# Patient Record
Sex: Female | Born: 1970 | Race: White | Hispanic: No | Smoking: Never smoker
Health system: Southern US, Community
[De-identification: ages and names within clinical notes are randomized; demographics above are authoritative.]

## PROBLEM LIST (undated history)

## (undated) DIAGNOSIS — K5792 Diverticulitis of intestine, part unspecified, without perforation or abscess without bleeding: Secondary | ICD-10-CM

## (undated) DIAGNOSIS — R569 Unspecified convulsions: Secondary | ICD-10-CM

## (undated) DIAGNOSIS — F32A Depression, unspecified: Secondary | ICD-10-CM

## (undated) HISTORY — PX: COLOSTOMY REVERSAL: SHX5782

---

## 2016-09-24 DIAGNOSIS — F419 Anxiety disorder, unspecified: Secondary | ICD-10-CM | POA: Insufficient documentation

## 2016-11-10 DIAGNOSIS — F1190 Opioid use, unspecified, uncomplicated: Secondary | ICD-10-CM | POA: Insufficient documentation

## 2019-05-02 ENCOUNTER — Emergency Department (HOSPITAL_COMMUNITY): Payer: Self-pay

## 2019-05-02 ENCOUNTER — Emergency Department (HOSPITAL_COMMUNITY)
Admission: EM | Admit: 2019-05-02 | Discharge: 2019-05-02 | Disposition: A | Discharge status: Home / Self Care | Payer: Self-pay | Attending: Emergency Medicine | Admitting: Emergency Medicine

## 2019-05-02 ENCOUNTER — Other Ambulatory Visit: Payer: Self-pay

## 2019-05-02 DIAGNOSIS — W450XXA Nail entering through skin, initial encounter: Secondary | ICD-10-CM | POA: Insufficient documentation

## 2019-05-02 DIAGNOSIS — S91331A Puncture wound without foreign body, right foot, initial encounter: Secondary | ICD-10-CM | POA: Insufficient documentation

## 2019-05-02 DIAGNOSIS — Y929 Unspecified place or not applicable: Secondary | ICD-10-CM | POA: Insufficient documentation

## 2019-05-02 DIAGNOSIS — Y9389 Activity, other specified: Secondary | ICD-10-CM | POA: Insufficient documentation

## 2019-05-02 DIAGNOSIS — Y999 Unspecified external cause status: Secondary | ICD-10-CM | POA: Insufficient documentation

## 2019-05-02 DIAGNOSIS — Z5321 Procedure and treatment not carried out due to patient leaving prior to being seen by health care provider: Secondary | ICD-10-CM | POA: Insufficient documentation

## 2019-05-02 MED ORDER — LIDOCAINE HCL (PF) 1 % IJ SOLN: 5.0000 mL | Freq: Once | INTRAMUSCULAR | Status: DC

## 2019-05-02 NOTE — ED Notes (Signed)
Pt stated that she will no longer be waiting. Armband removed and pt walked out of ED.

## 2019-05-02 NOTE — ED Provider Notes (Signed)
MSE was initiated and I personally evaluated the patient and placed orders (if any) at  2:30 PM on May 02, 2019.  The patient appears stable so that the remainder of the MSE may be completed by another provider. Pt stepped on a nail.  Pt appears to have a foreign body in foot.  Xray ordered   Elson Areas, PA-C 05/02/19 1431    Gwyneth Sprout, MD 05/02/19 [phone]

## 2019-05-02 NOTE — ED Triage Notes (Signed)
Last night pt was kicking wood to break it up for a fire and a nail went through her shoe and punctured R her foot. Pt has small amount of redness to site. Endorses pain.

## 2019-05-03 ENCOUNTER — Emergency Department (HOSPITAL_COMMUNITY)
Admission: EM | Admit: 2019-05-03 | Discharge: 2019-05-03 | Disposition: A | Discharge status: Home / Self Care | Payer: Medicaid Other | Attending: Emergency Medicine | Admitting: Emergency Medicine

## 2019-05-03 DIAGNOSIS — W450XXA Nail entering through skin, initial encounter: Secondary | ICD-10-CM | POA: Insufficient documentation

## 2019-05-03 DIAGNOSIS — M545 Low back pain, unspecified: Secondary | ICD-10-CM

## 2019-05-03 DIAGNOSIS — Y929 Unspecified place or not applicable: Secondary | ICD-10-CM | POA: Insufficient documentation

## 2019-05-03 DIAGNOSIS — Y998 Other external cause status: Secondary | ICD-10-CM | POA: Insufficient documentation

## 2019-05-03 DIAGNOSIS — M79671 Pain in right foot: Secondary | ICD-10-CM

## 2019-05-03 DIAGNOSIS — S91331A Puncture wound without foreign body, right foot, initial encounter: Secondary | ICD-10-CM | POA: Insufficient documentation

## 2019-05-03 DIAGNOSIS — T148XXA Other injury of unspecified body region, initial encounter: Secondary | ICD-10-CM

## 2019-05-03 DIAGNOSIS — Y9389 Activity, other specified: Secondary | ICD-10-CM | POA: Insufficient documentation

## 2019-05-03 LAB — CBC
HCT: 41 % (ref 36.0–46.0)
Hemoglobin: 13.7 g/dL (ref 12.0–15.0)
MCH: 34 pg (ref 26.0–34.0)
MCHC: 33.4 g/dL (ref 30.0–36.0)
MCV: 101.7 fL — ABNORMAL HIGH (ref 80.0–100.0)
Platelets: 208 10*3/uL (ref 150–400)
RBC: 4.03 MIL/uL (ref 3.87–5.11)
RDW: 13.8 % (ref 11.5–15.5)
WBC: 7.6 10*3/uL (ref 4.0–10.5)
nRBC: 0 % (ref 0.0–0.2)

## 2019-05-03 MED ORDER — NAPROXEN 500 MG PO TABS: 500.0000 mg | ORAL_TABLET | Freq: Two times a day (BID) | ORAL | 0 refills | Status: DC

## 2019-05-03 MED ORDER — METHOCARBAMOL 500 MG PO TABS
500.0000 mg | ORAL_TABLET | Freq: Three times a day (TID) | ORAL | 0 refills | Status: DC | PRN
Start: 2019-05-03 — End: 2019-06-13

## 2019-05-03 MED ORDER — CIPROFLOXACIN HCL 500 MG PO TABS
500.0000 mg | ORAL_TABLET | Freq: Two times a day (BID) | ORAL | 0 refills | Status: DC
Start: 2019-05-03 — End: 2019-06-13

## 2019-05-03 NOTE — ED Triage Notes (Addendum)
Patient reports she went to step on a piece of fire wood yesterday with flip flops , stepped on nail and it went through her foot. bottom Right foot around great toe area noted to be reddened. Patient states when she jerked back she also hurt her back, so she is in pain from her back to her foot. Patient states her tetanus is UTD

## 2019-05-03 NOTE — Discharge Instructions (Addendum)
You were seen in the ED today for a foot injury.  Your xray from yesterday did not show any fractures or retained foreign bodies.   We are sending you home with the following medicines to help with pain:  - Naproxen is a nonsteroidal anti-inflammatory medication that will help with pain and swelling. Be sure to take this medication as prescribed with food, 1 pill every 12 hours,  It should be taken with food, as it can cause stomach upset, and more seriously, stomach bleeding. Do not take other nonsteroidal anti-inflammatory medications with this such as Advil, Motrin, Aleve, Mobic, Goodie Powder, or Motrin.    - Robaxin is the muscle relaxer I have prescribed, this is meant to help with muscle tightness. Be aware that this medication may make you drowsy therefore the first time you take this it should be at a time you are in an environment where you can rest. Do not drive or operate heavy machinery when taking this medication. Do not drink alcohol or take other sedating medications with this medicine such as narcotics or benzodiazepines.   You make take Tylenol per over the counter dosing with these medications.   We are also sending you home with Ciprofloxacin, an antibiotic, this is to help prevent infection.  The specific antibiotic makes you higher risk for tendon/muscle injury such as an Achilles tendon tear, do not participate in sports, quick movements, or exercise type activities while taking this medication.   We have prescribed you new medication(s) today. Discuss the medications prescribed today with your pharmacist as they can have adverse effects and interactions with your other medicines including over the counter and prescribed medications. Seek medical evaluation if you start to experience new or abnormal symptoms after taking one of these medicines, seek care immediately if you start to experience difficulty breathing, feeling of your throat closing, facial swelling, or rash as these  could be indications of a more serious allergic reaction   Please follow-up with your primary care provider within 3 days for wound recheck.  Return to the ER for new or worsening symptoms including but not limited to increased pain, spreading redness, drainage, fever, chills, or any other concerns.

## 2019-05-03 NOTE — ED Provider Notes (Signed)
[facility] DEPT Provider Note   CSN: [id number] Arrival date & time: 05/03/19  4098     History Chief Complaint  Patient presents with  . Foot Pain  . Back Pain    Tina Torres is a 49 y.o. female without significant past medical history who presents to the emergency department with right foot pain status post injury 1.5 days prior.  Patient states that she was trying to break a piece of wood when she stomped down on it which resulted in a nail puncturing her foot through the bottom of her rubber sandal.  She states that she had to pull the nail out of her foot, she immediately cleaned this in the shower.  States she is having pain to the foot as well as to the right lower back, she thinks she hurt the back with the impact of the stomping.  She is also noticed some mild redness to the puncture wound site of the foot.  Pain is worse with weightbearing, no alleviating factors.  She went to an alternative ED yesterday but left due to wait times.  She denies fever, drainage from the wound, chills, expanding redness, numbness, weakness, or incontinence.  She denies history of diabetes.  Her last tetanus was within the past 5 years.  HPI     History reviewed. No pertinent past medical history.  There are no problems to display for this patient.   History reviewed. No pertinent surgical history.   OB History   No obstetric history on file.     History reviewed. No pertinent family history.  Social History   Tobacco Use  . Smoking status: Not on file  Substance Use Topics  . Alcohol use: Not on file  . Drug use: Not on file    Home Medications Prior to Admission medications   Not on File    Allergies    Patient has no known allergies.  Review of Systems   Review of Systems  Constitutional: Negative for chills and fever.  Respiratory: Negative for shortness of breath.   Cardiovascular: Negative for chest pain.  Gastrointestinal:  Negative for abdominal pain, nausea and vomiting.  Musculoskeletal: Positive for arthralgias and back pain.  Skin: Positive for color change and wound.  Neurological: Negative for weakness and numbness.       Negative for incontinence or saddle anesthesia.  All other systems reviewed and are negative.   Physical Exam Updated Vital Signs BP 129/78 (BP Location: Left Arm)   Pulse 90   Temp 98.2 F (36.8 C) (Oral)   Resp 16   Ht 5\' 2"  (1.575 m)   Wt 68 kg   SpO2 98%   BMI 27.44 kg/m   Physical Exam Vitals and nursing note reviewed.  Constitutional:      General: She is not in acute distress.    Appearance: She is not ill-appearing or toxic-appearing.  HENT:     Head: Normocephalic and atraumatic.  Cardiovascular:     Pulses:          Dorsalis pedis pulses are 2+ on the right side and 2+ on the left side.       Posterior tibial pulses are 2+ on the right side and 2+ on the left side.  Pulmonary:     Effort: Pulmonary effort is normal.  Musculoskeletal:     Comments: Back: No midline tenderness to palpation.  Right gluteal tenderness and right lower paraspinal muscle tenderness. Lower extremities: Patient has findings  consistent with a puncture wound to the plantar aspect of the first MTP area.  There is mild surrounding erythema.  No purulent drainage.  No palpable or visible foreign bodies.  No palpable fluctuance.  This area as well as the diffuse forefoot is tender to palpation.  Lower extremities are otherwise nontender.  Neurovascularly intact distally.  Skin:    General: Skin is warm and dry.     Capillary Refill: Capillary refill takes less than 2 seconds.  Neurological:     Mental Status: She is alert.     Comments: Alert. Clear speech. Sensation grossly intact to bilateral lower extremities. 5/5 strength with plantar/dorsiflexion bilaterally. Patient ambulatory with somewhat antalgic gait.  Psychiatric:        Mood and Affect: Mood normal.        Behavior: Behavior  normal.         ED Results / Procedures / Treatments   Labs (all labs ordered are listed, but only abnormal results are displayed) Labs Reviewed  CBC - Abnormal; Notable for the following components:      Result Value   MCV 101.7 (*)    All other components within normal limits    EKG None  Radiology DG Foot 2 Views Right  Result Date: 05/02/2019 CLINICAL DATA:  Nail puncture injury of the right foot last night. EXAM: RIGHT FOOT - 2 VIEW COMPARISON:  None. FINDINGS: No evidence of fracture or radiopaque foreign object. No arthritis. No soft tissue abnormality seen. IMPRESSION: Negative radiographs. Electronically Signed   By: Paulina Fusi M.D.   On: 05/02/2019 14:44    Procedures Procedures (including critical care time)  Medications Ordered in ED Medications - No data to display  ED Course  I have reviewed the triage vital signs and the nursing notes.  Pertinent labs & imaging results that were available during my care of the patient were reviewed by me and considered in my medical decision making (see chart for details).    MDM Rules/Calculators/A&P                      Patient presents to the emergency department status post right foot injury during which she had a nail puncture her right foot through her shoe.  She is nontoxic, resting comfortably, vitals WNL. CBC obtained per triage reviewed- no leukocytosis.  X-ray obtained at ED visit yesterday- personally reviewed & interpreted-no evidence of fracture or radiopaque foreign body seen.  She has not had recurrent injury therefore do not feel this needs repeat imaging.  She has no midline spinal tenderness therefore do not feel that back imaging is necessary, back pain likely muscular in nature.  She has some mild surrounding erythema to the puncture wound site.  Will start on ciprofloxacin for Pseudomonas coverage given puncture wound through shoe.  Her tetanus is up-to-date. Naproxen and Robaxin prescribed to help with  discomfort, discussed no driving or operating heavy machinery when taking Robaxin.  PCP follow-up for wound recheck. I discussed results, treatment plan, need for follow-up, and return precautions with the patient. Provided opportunity for questions, patient confirmed understanding and is in agreement with plan.   Final Clinical Impression(s) / ED Diagnoses Final diagnoses:  Puncture wound  Right foot pain  Acute right-sided low back pain without sciatica    Rx / DC Orders ED Discharge Orders         Ordered    naproxen (NAPROSYN) 500 MG tablet  2 times daily  05/03/19 1300    methocarbamol (ROBAXIN) 500 MG tablet  Every 8 hours PRN     05/03/19 1300    ciprofloxacin (CIPRO) 500 MG tablet  Every 12 hours     05/03/19 1300           Keamber Macfadden, Tebbetts, PA-C 05/03/19 1302    Lorre Nick, MD 05/04/19 [phone]

## 2019-06-10 ENCOUNTER — Other Ambulatory Visit: Payer: Self-pay

## 2019-06-10 ENCOUNTER — Encounter (HOSPITAL_COMMUNITY): Payer: Self-pay

## 2019-06-10 ENCOUNTER — Emergency Department (HOSPITAL_COMMUNITY)
Admission: EM | Admit: 2019-06-10 | Discharge: 2019-06-11 | Disposition: A | Discharge status: Other Acute Inpatient Hospital | Payer: Medicaid Other | Attending: Emergency Medicine | Admitting: Emergency Medicine

## 2019-06-10 DIAGNOSIS — F10920 Alcohol use, unspecified with intoxication, uncomplicated: Secondary | ICD-10-CM

## 2019-06-10 DIAGNOSIS — Z20822 Contact with and (suspected) exposure to covid-19: Secondary | ICD-10-CM | POA: Insufficient documentation

## 2019-06-10 DIAGNOSIS — F10129 Alcohol abuse with intoxication, unspecified: Secondary | ICD-10-CM | POA: Insufficient documentation

## 2019-06-10 DIAGNOSIS — R45851 Suicidal ideations: Secondary | ICD-10-CM

## 2019-06-10 LAB — SALICYLATE LEVEL: Salicylate Lvl: <7 mg/dL — ABNORMAL LOW (ref 7.0–30.0)

## 2019-06-10 LAB — CBC
HCT: 39.2 % (ref 36.0–46.0)
Hemoglobin: 13.2 g/dL (ref 12.0–15.0)
MCH: 32.8 pg (ref 26.0–34.0)
MCHC: 33.7 g/dL (ref 30.0–36.0)
MCV: 97.3 fL (ref 80.0–100.0)
Platelets: 340 10*3/uL (ref 150–400)
RBC: 4.03 MIL/uL (ref 3.87–5.11)
RDW: 12.9 % (ref 11.5–15.5)
WBC: 10.6 10*3/uL — ABNORMAL HIGH (ref 4.0–10.5)
nRBC: 0 % (ref 0.0–0.2)

## 2019-06-10 LAB — COMPREHENSIVE METABOLIC PANEL
ALT: 14 U/L (ref 0–44)
AST: 21 U/L (ref 15–41)
Albumin: 3.7 g/dL (ref 3.5–5.0)
Alkaline Phosphatase: 57 U/L (ref 38–126)
Anion gap: 10 (ref 5–15)
BUN: 17 mg/dL (ref 6–20)
CO2: 24 mmol/L (ref 22–32)
Calcium: 8.6 mg/dL — ABNORMAL LOW (ref 8.9–10.3)
Chloride: 105 mmol/L (ref 98–111)
Creatinine, Ser: 0.6 mg/dL (ref 0.44–1.00)
GFR calc Af Amer: >60 mL/min (ref >60)
GFR calc non Af Amer: >60 mL/min (ref >60)
Glucose, Bld: 105 mg/dL — ABNORMAL HIGH (ref 70–99)
Potassium: 3.7 mmol/L (ref 3.5–5.1)
Sodium: 139 mmol/L (ref 135–145)
Total Bilirubin: 0.7 mg/dL (ref 0.3–1.2)
Total Protein: 7.3 g/dL (ref 6.5–8.1)

## 2019-06-10 LAB — ACETAMINOPHEN LEVEL: Acetaminophen (Tylenol), Serum: <10 ug/mL — ABNORMAL LOW (ref 10–30)

## 2019-06-10 LAB — RAPID URINE DRUG SCREEN, HOSP PERFORMED
Amphetamines: NOT DETECTED
Barbiturates: NOT DETECTED
Benzodiazepines: NOT DETECTED
Cocaine: NOT DETECTED
Opiates: NOT DETECTED
Tetrahydrocannabinol: NOT DETECTED

## 2019-06-10 LAB — ETHANOL: Alcohol, Ethyl (B): 195 mg/dL — ABNORMAL HIGH (ref <10)

## 2019-06-10 MED ORDER — LORAZEPAM 2 MG/ML IJ SOLN: 1.0000 mg | Freq: Once | INTRAMUSCULAR | Status: DC

## 2019-06-10 MED ORDER — LORAZEPAM 1 MG PO TABS
1.0000 mg | ORAL_TABLET | Freq: Once | ORAL | Status: AC
  Administered 2019-06-10: 1 mg via ORAL
  Filled 2019-06-10: qty 1

## 2019-06-10 NOTE — ED Triage Notes (Signed)
Pt reports SI. States that she was taken off of Ritalin a few months ago. She also states that she is withdrawing from alcohol. Her last drink was this morning and she usually drinks about a 5th per day. States that she would plan to take pure fentanyl or drive off of a bridge.

## 2019-06-10 NOTE — ED Provider Notes (Signed)
[facility]-EMERGENCY DEPT Provider Note   CSN: [id number] Arrival date & time: 06/10/19  1957     History Chief Complaint  Patient presents with  . Suicidal    Tina Torres is a 49 y.o. female.  49 y.o female with a PMH of alcoho abuse presents to the ED with a chief complaint of SI, alcohol withdrawals.  Patient reports her last drink was this morning including 1/5 of vodka.  She reports she is " into withdrawals ".  Patient also reports SI, states she has had a plan, has had prior attempts with overdose on "multiple things", states the only thing she has not tried is to jump off a cliff.  Reports no pain on today's visit such as chest pain, shortness of breath, palpitations.  No fevers, coughs.  Patient reports she is currently withdrawing however her last drink was this morning.  No HI, hallucinations.  The history is provided by the patient and medical records.       History reviewed. No pertinent past medical history.  There are no problems to display for this patient.   History reviewed. No pertinent surgical history.   OB History   No obstetric history on file.     History reviewed. No pertinent family history.  Social History   Tobacco Use  . Smoking status: Not on file  Substance Use Topics  . Alcohol use: Not on file  . Drug use: Not on file    Home Medications Prior to Admission medications   Medication Sig Start Date End Date Taking? Authorizing Provider  ciprofloxacin (CIPRO) 500 MG tablet Take 1 tablet (500 mg total) by mouth every 12 (twelve) hours. 05/03/19   Petrucelli, Samantha R, PA-C  methocarbamol (ROBAXIN) 500 MG tablet Take 1 tablet (500 mg total) by mouth every 8 (eight) hours as needed for muscle spasms. 05/03/19   Petrucelli, Samantha R, PA-C  naproxen (NAPROSYN) 500 MG tablet Take 1 tablet (500 mg total) by mouth 2 (two) times daily. 05/03/19   Petrucelli, Pleas Koch, PA-C    Allergies    Patient has no known  allergies.  Review of Systems   Review of Systems  Constitutional: Negative for fever.  Respiratory: Negative for shortness of breath.   Cardiovascular: Negative for chest pain.  Gastrointestinal: Negative for abdominal pain.  Genitourinary: Negative for flank pain.  Musculoskeletal: Negative for back pain.  Neurological: Negative for headaches.  Psychiatric/Behavioral: Positive for suicidal ideas. The patient is nervous/anxious.   All other systems reviewed and are negative.   Physical Exam Updated Vital Signs BP 108/75 (BP Location: Left Arm)   Pulse 99   Temp 98.2 F (36.8 C) (Oral)   Resp 18   SpO2 95%   Physical Exam Vitals and nursing note reviewed.  Constitutional:      General: She is not in acute distress.    Appearance: She is well-developed.  HENT:     Head: Normocephalic and atraumatic.     Mouth/Throat:     Pharynx: No oropharyngeal exudate.  Eyes:     Pupils: Pupils are equal, round, and reactive to light.  Cardiovascular:     Rate and Rhythm: Regular rhythm.     Heart sounds: Normal heart sounds.  Pulmonary:     Effort: Pulmonary effort is normal. No respiratory distress.     Breath sounds: Normal breath sounds.  Abdominal:     General: Bowel sounds are normal. There is no distension.     Palpations:  Abdomen is soft.     Tenderness: There is no abdominal tenderness.  Musculoskeletal:        General: No tenderness or deformity.     Cervical back: Normal range of motion.     Right lower leg: No edema.     Left lower leg: No edema.  Skin:    General: Skin is warm and dry.  Neurological:     Mental Status: She is alert and oriented to person, place, and time.  Psychiatric:        Mood and Affect: Mood is anxious.        Speech: Speech is delayed.        Behavior: Behavior is cooperative.        Thought Content: Thought content includes suicidal ideation.     ED Results / Procedures / Treatments   Labs (all labs ordered are listed, but only  abnormal results are displayed) Labs Reviewed  COMPREHENSIVE METABOLIC PANEL - Abnormal; Notable for the following components:      Result Value   Glucose, Bld 105 (*)    Calcium 8.6 (*)    All other components within normal limits  ETHANOL - Abnormal; Notable for the following components:   Alcohol, Ethyl (B) 195 (*)    All other components within normal limits  SALICYLATE LEVEL - Abnormal; Notable for the following components:   Salicylate Lvl <4.5 (*)    All other components within normal limits  ACETAMINOPHEN LEVEL - Abnormal; Notable for the following components:   Acetaminophen (Tylenol), Serum <10 (*)    All other components within normal limits  CBC - Abnormal; Notable for the following components:   WBC 10.6 (*)    All other components within normal limits  RAPID URINE DRUG SCREEN, HOSP PERFORMED    EKG None  Radiology No results found.  Procedures Procedures (including critical care time)  Medications Ordered in ED Medications  LORazepam (ATIVAN) tablet 1 mg (1 mg Oral Given 06/10/19 2257)    ED Course  I have reviewed the triage vital signs and the nursing notes.  Pertinent labs & imaging results that were available during my care of the patient were reviewed by me and considered in my medical decision making (see chart for details).    MDM Rules/Calculators/A&P     Patient with a past medical history of alcohol abuse presents to the ED with complaints of alcohol withdrawals along with suicidal ideations.  Patient reports her last drink was this morning including 1/5 of vodka, reports she now feels somewhat anxious.  Does report SI, has had that in the past with an overdose.  Reports she started everything" I have not tried jumping off a bridge ".  Does not endorse any chest pain, shortness of breath.  During evaluation she appears to be intoxicated, I suspect alcohol is still in her system at this time.  Lungs are clear to auscultation without any wheezing,  rhonchi, rales.  Abdomen is soft nontender to palpation.  She is ambulatory with a steady gait.  Interpretation of her labs showed a CBC with a mild leukocytosis.  CMP without any electrolyte derangement.  Creatinine level is within normal limits.  LFTs are unremarkable.  Salicylate and acetaminophen level are within normal limits.  Ethanol level is 185 today, I do not believe patient is in withdrawals at this time.  His CIWA score was calculated.  Per nursing staff patient continues to complain of feeling "bugs crawling all over her ", no  bugs present in the room.  We will provide her 1 mg of oral Ativan to help with symptoms.  A TTS consult has been placed to evaluate patient for SI at this time.  According to chart review she does have a prior visit in 2018 for alcohol withdrawals.  11:43 PM patient received 1 mg of Ativan p.o., has been resting comfortably.  She will need to be assessed by TTS.  She is currently medically clear.    Patient care signout to incoming team pending TTS.     Portions of this note were generated with Scientist, clinical (histocompatibility and immunogenetics). Dictation errors may occur despite best attempts at proofreading.  Final Clinical Impression(s) / ED Diagnoses Final diagnoses:  Suicidal ideation  Alcoholic intoxication without complication [facility])    Rx / DC Orders ED Discharge Orders    None       Claude Manges, PA-C 06/10/19 2344    Mancel Bale, MD 06/11/19 [phone]

## 2019-06-11 ENCOUNTER — Encounter (HOSPITAL_COMMUNITY): Payer: Self-pay | Admitting: Nurse Practitioner

## 2019-06-11 ENCOUNTER — Inpatient Hospital Stay (HOSPITAL_COMMUNITY)
Admission: AD | Admit: 2019-06-11 | Discharge: 2019-06-13 | DRG: 885 | Disposition: A | Discharge status: Home / Self Care | Payer: Federal, State, Local not specified - Other | Source: Intra-hospital | Attending: Psychiatry | Admitting: Psychiatry

## 2019-06-11 DIAGNOSIS — F1994 Other psychoactive substance use, unspecified with psychoactive substance-induced mood disorder: Secondary | ICD-10-CM | POA: Diagnosis not present

## 2019-06-11 DIAGNOSIS — Z20822 Contact with and (suspected) exposure to covid-19: Secondary | ICD-10-CM | POA: Diagnosis present

## 2019-06-11 DIAGNOSIS — R45851 Suicidal ideations: Secondary | ICD-10-CM | POA: Diagnosis present

## 2019-06-11 DIAGNOSIS — F332 Major depressive disorder, recurrent severe without psychotic features: Secondary | ICD-10-CM | POA: Diagnosis present

## 2019-06-11 DIAGNOSIS — F17210 Nicotine dependence, cigarettes, uncomplicated: Secondary | ICD-10-CM | POA: Diagnosis present

## 2019-06-11 DIAGNOSIS — F1024 Alcohol dependence with alcohol-induced mood disorder: Secondary | ICD-10-CM | POA: Diagnosis not present

## 2019-06-11 DIAGNOSIS — F10239 Alcohol dependence with withdrawal, unspecified: Secondary | ICD-10-CM | POA: Diagnosis present

## 2019-06-11 DIAGNOSIS — Z915 Personal history of self-harm: Secondary | ICD-10-CM

## 2019-06-11 DIAGNOSIS — F419 Anxiety disorder, unspecified: Secondary | ICD-10-CM | POA: Diagnosis present

## 2019-06-11 DIAGNOSIS — G4700 Insomnia, unspecified: Secondary | ICD-10-CM | POA: Diagnosis present

## 2019-06-11 LAB — SARS CORONAVIRUS 2 BY RT PCR (HOSPITAL ORDER, PERFORMED IN ~~LOC~~ HOSPITAL LAB): SARS Coronavirus 2: NEGATIVE

## 2019-06-11 MED ORDER — RISPERIDONE 1 MG PO TBDP
1.0000 mg | ORAL_TABLET | Freq: Every day | ORAL | Status: DC
  Administered 2019-06-12: 1 mg via ORAL
  Filled 2019-06-11: qty 7
  Filled 2019-06-11 (×4): qty 1

## 2019-06-11 MED ORDER — CITALOPRAM HYDROBROMIDE 10 MG PO TABS
10.0000 mg | ORAL_TABLET | Freq: Every day | ORAL | Status: DC
  Administered 2019-06-11 – 2019-06-13 (×3): 10 mg via ORAL
  Filled 2019-06-11 (×5): qty 1
  Filled 2019-06-11: qty 7

## 2019-06-11 MED ORDER — GABAPENTIN 300 MG PO CAPS
300.0000 mg | ORAL_CAPSULE | Freq: Three times a day (TID) | ORAL | Status: DC
  Administered 2019-06-11 – 2019-06-13 (×6): 300 mg via ORAL
  Filled 2019-06-11 (×4): qty 1
  Filled 2019-06-11: qty 21
  Filled 2019-06-11: qty 1
  Filled 2019-06-11 (×2): qty 21
  Filled 2019-06-11 (×4): qty 1

## 2019-06-11 MED ORDER — THIAMINE HCL 100 MG PO TABS
100.0000 mg | ORAL_TABLET | Freq: Every day | ORAL | Status: DC
  Administered 2019-06-12 – 2019-06-13 (×2): 100 mg via ORAL
  Filled 2019-06-11 (×4): qty 1

## 2019-06-11 MED ORDER — MAGNESIUM HYDROXIDE 400 MG/5ML PO SUSP: 30.0000 mL | Freq: Every day | ORAL | Status: DC | PRN

## 2019-06-11 MED ORDER — ONDANSETRON 4 MG PO TBDP
4.0000 mg | ORAL_TABLET | Freq: Four times a day (QID) | ORAL | Status: DC | PRN
  Administered 2019-06-12: 4 mg via ORAL
  Filled 2019-06-11: qty 1

## 2019-06-11 MED ORDER — HYDROXYZINE HCL 25 MG PO TABS
25.0000 mg | ORAL_TABLET | Freq: Three times a day (TID) | ORAL | Status: DC | PRN
  Administered 2019-06-11 – 2019-06-12 (×3): 25 mg via ORAL
  Filled 2019-06-11: qty 10
  Filled 2019-06-11 (×3): qty 1

## 2019-06-11 MED ORDER — ACETAMINOPHEN 325 MG PO TABS
650.0000 mg | ORAL_TABLET | Freq: Four times a day (QID) | ORAL | Status: DC | PRN
  Administered 2019-06-12: 650 mg via ORAL
  Filled 2019-06-11: qty 2

## 2019-06-11 MED ORDER — CHLORDIAZEPOXIDE HCL 25 MG PO CAPS
25.0000 mg | ORAL_CAPSULE | Freq: Four times a day (QID) | ORAL | Status: DC | PRN
  Administered 2019-06-12 – 2019-06-13 (×4): 25 mg via ORAL
  Filled 2019-06-11 (×5): qty 1

## 2019-06-11 MED ORDER — THIAMINE HCL 100 MG/ML IJ SOLN: 100.0000 mg | Freq: Every day | INTRAMUSCULAR | Status: DC

## 2019-06-11 MED ORDER — LORAZEPAM 2 MG/ML IJ SOLN: 0.0000 mg | Freq: Two times a day (BID) | INTRAMUSCULAR | Status: DC

## 2019-06-11 MED ORDER — HYDROXYZINE HCL 25 MG PO TABS
25.0000 mg | ORAL_TABLET | Freq: Once | ORAL | Status: AC
  Administered 2019-06-11: 25 mg via ORAL
  Filled 2019-06-11: qty 1

## 2019-06-11 MED ORDER — LOPERAMIDE HCL 2 MG PO CAPS: 2.0000 mg | ORAL_CAPSULE | ORAL | Status: DC | PRN

## 2019-06-11 MED ORDER — THIAMINE HCL 100 MG PO TABS: 100.0000 mg | ORAL_TABLET | Freq: Every day | ORAL | Status: DC

## 2019-06-11 MED ORDER — LORAZEPAM 1 MG PO TABS
0.0000 mg | ORAL_TABLET | Freq: Four times a day (QID) | ORAL | Status: DC
  Administered 2019-06-11 (×2): 1 mg via ORAL
  Filled 2019-06-11 (×2): qty 1

## 2019-06-11 MED ORDER — ADULT MULTIVITAMIN W/MINERALS CH
1.0000 | ORAL_TABLET | Freq: Every day | ORAL | Status: DC
  Administered 2019-06-11 – 2019-06-13 (×3): 1 via ORAL
  Filled 2019-06-11 (×6): qty 1

## 2019-06-11 MED ORDER — ALUM & MAG HYDROXIDE-SIMETH 200-200-20 MG/5ML PO SUSP: 30.0000 mL | ORAL | Status: DC | PRN

## 2019-06-11 MED ORDER — LORAZEPAM 1 MG PO TABS
1.0000 mg | ORAL_TABLET | Freq: Four times a day (QID) | ORAL | Status: DC | PRN
  Administered 2019-06-11: 1 mg via ORAL
  Filled 2019-06-11: qty 1

## 2019-06-11 MED ORDER — LORAZEPAM 2 MG/ML IJ SOLN: 0.0000 mg | Freq: Four times a day (QID) | INTRAMUSCULAR | Status: DC

## 2019-06-11 MED ORDER — TRAZODONE HCL 50 MG PO TABS
50.0000 mg | ORAL_TABLET | Freq: Every evening | ORAL | Status: DC | PRN
  Administered 2019-06-12: 50 mg via ORAL
  Filled 2019-06-11 (×2): qty 1

## 2019-06-11 MED ORDER — RISPERIDONE 0.5 MG PO TBDP
0.5000 mg | ORAL_TABLET | Freq: Every day | ORAL | Status: DC
  Administered 2019-06-11 – 2019-06-13 (×3): 0.5 mg via ORAL
  Filled 2019-06-11 (×2): qty 1
  Filled 2019-06-11: qty 7
  Filled 2019-06-11 (×3): qty 1

## 2019-06-11 MED ORDER — LORAZEPAM 1 MG PO TABS: 0.0000 mg | ORAL_TABLET | Freq: Two times a day (BID) | ORAL | Status: DC

## 2019-06-11 NOTE — Progress Notes (Signed)
Patient is a 49 year old female who presented unaccompanied to [facility] with complaints of increasing depression and SI over the last few months.  Pt has a hx of alcohol abuse and reports relapsing on alcohol- drinking a fifth of vodka daily for a month. Pt currently denies SI/HI and A/VH. Pt endorses feelings of hopelessness, anxiety , loneliness, and sadness. Pt presents as irritable, complaining of withdrawal symptoms (shakiness, anxiety) and states, "I just need some ativan". Pt was calm and cooperative, answered questions logically and coherently throughout admission interview. VS obtained and were WNL. Skin assessment revealed no abnormalities- belongings searched and secured in locker. Q 15 min checks and fall precautions initiated for safety. Patient oriented to unit and provided with lunch.

## 2019-06-11 NOTE — Tx Team (Signed)
Initial Treatment Plan 06/11/2019 2:17 PM Tina Torres KPW:[id number]    PATIENT STRESSORS: Financial difficulties Medication change or noncompliance Substance abuse   PATIENT STRENGTHS: Average or above average intelligence Capable of independent living Communication skills Physical Health   PATIENT IDENTIFIED PROBLEMS:     "I'm feeling overwhelmed"     "I can't function"     " I need to get on the right medication"         DISCHARGE CRITERIA:  Improved stabilization in mood, thinking, and/or behavior Verbal commitment to aftercare and medication compliance Withdrawal symptoms are absent or subacute and managed without 24-hour nursing intervention  PRELIMINARY DISCHARGE PLAN: Attend 12-step recovery group Outpatient therapy Return to previous living arrangement  PATIENT/FAMILY INVOLVEMENT: This treatment plan has been presented to and reviewed with the patient, Tina Torres, Tina Torres patient has been given the opportunity to ask questions and make suggestions.  Shela Nevin, RN 06/11/2019, 2:17 PM

## 2019-06-11 NOTE — ED Notes (Signed)
Attempted to call report to [facility]. Unable to make contact (phone rang with no answer)
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
## 2019-06-11 NOTE — ED Provider Notes (Signed)
°  Physical Exam  BP (!) 146/84 (BP Location: Left Arm)    Pulse 89    Temp 98.2 F (36.8 C) (Oral)    Resp 20    SpO2 97%   Physical Exam Vitals and nursing note reviewed.  Constitutional:      General: She is not in acute distress.    Appearance: She is well-developed. She is not diaphoretic.  HENT:     Head: Normocephalic and atraumatic.  Eyes:     General: No scleral icterus.    Conjunctiva/sclera: Conjunctivae normal.  Pulmonary:     Effort: Pulmonary effort is normal. No respiratory distress.  Musculoskeletal:     Cervical back: Normal range of motion.  Skin:    Findings: No rash.  Neurological:     Mental Status: She is alert.     ED Course/Procedures     Procedures  MDM  Patient requesting more Ativan.  She does not appear to be in withdrawals at this time, no tremors noted.  She is ambulating without difficulty.  Per TTS recommendations, she patient criteria to be transferred to [facility] in the morning.  Will order CIWA protocol and give Vistaril. Patient without any complaints during my evaluation.       Dietrich Pates, PA-C 06/11/19 0554    Ward, Layla Maw, DO 06/11/19 [phone]

## 2019-06-11 NOTE — ED Notes (Signed)
Attempted to call report to [facility], no answer. Contacted AC at [facility], was informed phone lines and systems are down, to call back in 25 minutes.

## 2019-06-11 NOTE — ED Notes (Signed)
Report given to [facility]. [facility] contacted at this time to get patient to [facility]
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
## 2019-06-11 NOTE — BHH Counselor (Signed)
Adult Comprehensive Assessment  Patient ID: Tina Torres, female   DOB: 1970-02-17, 49 y.o.   MRN: [id number]  Information Source: Information source: Patient  Current Stressors:  Patient states their primary concerns and needs for treatment are:: "I need my meds to be right, so that I'm not depressed or suicidal anymore" Patient states their goals for this hospitilization and ongoing recovery are:: "To get back on meds I was on when I was at [employer] / Learning stressors: Denies Employment / Job issues: Denies Family Relationships: No family Surveyor, quantity / Lack of resources (include bankruptcy): Yes Housing / Lack of housing: Lives in a halfway house Physical health (include injuries & life threatening diseases): "I feel like crap all the time" Social relationships: "No friends" Substance abuse: withdrawal from alcohol Bereavement / Loss: Grandmother passed away in Dec 01, 2018  Living/Environment/Situation:  Living Arrangements: Other (Comment)(currently residing in a halfway house) Living conditions (as described by patient or guardian): "Good" Who else lives in the home?: 3 other roommates How long has patient lived in current situation?: "A few months" What is atmosphere in current home: Supportive  Family History:  Marital status: Divorced Divorced, when?: "A long time ago" What types of issues is patient dealing with in the relationship?: "We fought a lot" Additional relationship information: None Are you sexually active?: Yes What is your sexual orientation?: Bisexual Has your sexual activity been affected by drugs, alcohol, medication, or emotional stress?: Emotional stress Does patient have children?: No  Childhood History:  By whom was/is the patient raised?: Both parents Additional childhood history information: "Good" Description of patient's relationship with caregiver when they were a child: "Good" Patient's description of current  relationship with people who raised him/her: No, they are deceased How were you disciplined when you got in trouble as a child/adolescent?: "Spanked" Does patient have siblings?: Yes Number of Siblings: 1 Description of patient's current relationship with siblings: Older sister- "Talk on occassion" Did patient suffer any verbal/emotional/physical/sexual abuse as a child?: No Did patient suffer from severe childhood neglect?: No Has patient ever been sexually abused/assaulted/raped as an adolescent or adult?: No Was the patient ever a victim of a crime or a disaster?: No Witnessed domestic violence?: No Has patient been affected by domestic violence as an adult?: No  Education:  Highest grade of school patient has completed: Some college Currently a Consulting civil engineer?: No Learning disability?: No  Employment/Work Situation:   Employment situation: Employed 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 How long has patient been employed?: 1 month Patient's job has been impacted by current illness: Yes Describe how patient's job has been impacted: "I'm not able to work right now, because I;m here" What is the longest time patient has a held a job?: 1-2 years 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 I drank yesterday" If attempted suicide, did drugs/alcohol play a role in this?: Yes Alcohol/Substance Abuse Treatment Hx: Past Tx, Inpatient, Past Tx, Outpatient, Past detox, Attends AA/NA If yes, describe treatment: "I've been everywhere" Has alcohol/substance abuse ever caused legal problems?: Yes(Has pending DUI)  Social Support System:   Patient's Community  Support System: None Describe Community Support System: "I don't have any" Type  of faith/religion: None How does patient's faith help to cope with current illness?: N/A  Leisure/Recreation:   Do You Have Hobbies?: Yes Leisure and Hobbies: Hiking  Strengths/Needs:   What is the patient's perception of their strengths?: "I don't know" Patient states they can use these personal strengths during their treatment to contribute to their recovery: None Patient states these barriers may affect/interfere with their treatment: "If they refuse to prescribe my ritalin" Patient states these barriers may affect their return to the community: No Other important information patient would like considered in planning for their treatment: "Just whatever they did at [facility]"  Discharge Plan:   Currently receiving community mental health services: Yes (From Whom)(Has been receiving services at [facility], however they are unwilling to prescribe her Ritalin and she is wanting to find a provider who will prescribe this for her.) Patient states concerns and preferences for aftercare planning are: "I don't have time for therapy. I want someone to prescribe my medicine" Patient states they will know when they are safe and ready for discharge when: "Yes, when I get the meds I need" Does patient have access to transportation?: Yes(Friend) Does patient have financial barriers related to discharge medications?: No Patient description of barriers related to discharge medications: None Will patient be returning to same living situation after discharge?: Yes  Summary/Recommendations:   Summary and Recommendations (to be completed by the evaluator): Tina Torres is an 49 y.o. divorced female who presents unaccompanied to [facility] reporting symptoms of depression, suicidal ideation, and alcohol use. Pt says she has a history of depression and has been severely depressed for the past five months. She says five  months ago her provider at [facility] discontinued Ritalin and then she stopped taking her other medications. She says she relapsed on alcohol and has been drinking approximately 1 fifth of liquor daily. She reports current suicidal ideation with plan to overdose on Fentanyl or drive off a bridge. She reports a history of two previous suicide attempts by overdose. Pt acknowledges symptoms including social withdrawal, loss of interest in usual pleasures, fatigue, irritability, decreased concentration, decreased appetite and feelings of guilt, worthlessness and hopelessness. Pt says she has been staying in bed. Pt denies any history of intentional self-injurious behaviors. Pt denies current homicidal ideation or history of violence. Pt denies any history of auditory or visual hallucinations. Pt denies use of substances other than alcohol. Pt says she has a history of alcohol withdrawal seizure. While here, Tina Torres can benefit from crisis stabilization, medication management, therapeutic milieu, and referrals for services  [place]. 06/11/2019

## 2019-06-11 NOTE — BH Assessment (Signed)
Accepted to [facility] (adult unit). Accepted by Ophelia Shoulder, NP. The attending provider is Dr. Jola Babinski. Bed assignment 305-2.  TTS will notify nursing staff of the time to transfer patient to [facility].

## 2019-06-11 NOTE — BH Assessment (Signed)
Tele Assessment Note   Patient Name: Tina Torres MRN: [id number] Referring Physician: Claude Manges, PA-C Location of Patient: [facility], [phone] Location of Provider: Behavioral Health TTS Department  Tina Torres is an 49 y.o. divorced female who presents unaccompanied to [facility] ED reporting symptoms of depression, suicidal ideation, and alcohol use. Pt says she has a history of depression and has been severely depressed for the past five months. She says five months ago her provider at [facility] discontinued Ritalin and then she stopped taking her other medications. She says she relapsed on alcohol and has been drinking approximately 1 fifth of liquor daily. She reports current suicidal ideation with plan to overdose on Fentanyl or drive off a bridge. She reports a history of two previous suicide attempts by overdose. Pt acknowledges symptoms including social withdrawal, loss of interest in usual pleasures, fatigue, irritability, decreased concentration, decreased appetite and feelings of guilt, worthlessness and hopelessness. Pt says she has been staying in bed. Pt denies any history of intentional self-injurious behaviors. Pt denies current homicidal ideation or history of violence. Pt denies any history of auditory or visual hallucinations. Pt denies use of substances other than alcohol. Pt says she has a history of alcohol withdrawal seizure.  Pt identifies her mental health and alcohol use as her primary stressor. She reports she has been charged with a DUI and has a court date pending. She says she is currently residing in an [address]. She says her friends at [facility] encouraged her to seek treatment and she believes she can return there. She says she was working but has told her employer she needs to take a leave. She denies history of abuse or trauma. She denies access to firearms. She says she has no current outpatient mental health providers. She says she has been  psychiatrically hospitalized in the past, the most recent at [facility] approximately one year ago.  Pt does not identify anyone to contact for collateral information.  Pt is casually dressed, drowsy and oriented x4. Pt speaks in a slightly slurred tone, at low volume and normal pace. Motor behavior appears normal. Eye contact is fair. Pt's mood is depressed and affect is depressed and irritable. Thought process is coherent and relevant. There is no indication Pt is currently responding to internal stimuli or experiencing delusional thought content. Pt was cooperative throughout assessment. She says she wants to be admitted to a psychiatric facility.   Diagnosis:  F33.2 Major depressive disorder, Recurrent episode, Severe F10.20 Alcohol use disorder, Severe  Past Medical History: History reviewed. No pertinent past medical history.  History reviewed. No pertinent surgical history.  Family History: History reviewed. No pertinent family history.  Social History:  has no history on file for tobacco, alcohol, and drug.  Additional Social History:  Alcohol / Drug Use Pain Medications: Denies abuse Prescriptions: Denies abuse Over the Counter: Denies abuse History of alcohol / drug use?: Yes Longest period of sobriety (when/how long): unknown Negative Consequences of Use: Financial, Legal, Personal relationships, Work / School Withdrawal Symptoms: Seizures, Sweats, Tremors, Nausea / Vomiting Onset of Seizures: Unknown Date of most recent seizure: 5 years ago Substance #1 Name of Substance 1: Alcohol 1 - Age of First Use: Adolescent 1 - Amount (size/oz): Approximately 1 fifth of liquor 1 - Frequency: Daily 1 - Duration: 3 months this episode 1 - Last Use / Amount: 06/10/2019  CIWA: CIWA-Ar BP: 108/75 Pulse Rate: 99 Nausea and Vomiting: 2 Tactile Disturbances: mild itching, pins  and needles, burning or numbness Tremor: no tremor Auditory Disturbances: not present Paroxysmal  Sweats: no sweat visible Visual Disturbances: not present Anxiety: two Headache, Fullness in Head: moderately severe Agitation: normal activity Orientation and Clouding of Sensorium: oriented and can do serial additions CIWA-Ar Total: 10 COWS:    Allergies: No Known Allergies  Home Medications: (Not in a hospital admission)   OB/GYN Status:  No LMP recorded. Patient is postmenopausal.  General Assessment Data Location of Assessment: [facility] ED TTS Assessment: In system Is this a Tele or Face-to-Face Assessment?: Tele Assessment Is this an Initial Assessment or a Re-assessment for this encounter?: Initial Assessment Patient Accompanied by:: N/A Language Other than English: No Living Arrangements: Other (Comment)([facility]) What gender do you identify as?: Female Date Telepsych consult ordered in [facility]: 06/10/19 Time Telepsych consult ordered in [facility]: 2234 Marital status: Divorced [place] name: NA Pregnancy Status: No Living Arrangements: Other (Comment)([facility]) Can pt return to current living arrangement?: Yes Admission Status: Voluntary Is patient capable of signing voluntary admission?: Yes Referral Source: Self/Family/Friend Insurance type: [place] Living Arrangements: Other (Comment)([facility]) Legal Guardian: Other:(Self) Name of Psychiatrist: None Name of Therapist: None  Education Status Is patient currently in school?: No Is the patient employed, unemployed or receiving disability?: Employed  Risk to self with the past 6 months Suicidal Ideation: Yes-Currently Present Has patient been a risk to self within the past 6 months prior to admission? : Yes Suicidal Intent: Yes-Currently Present Has patient had any suicidal intent within the past 6 months prior to admission? : Yes Is patient at risk for suicide?: Yes Suicidal Plan?: Yes-Currently Present Has patient had any suicidal plan within the past 6 months prior to admission? :  Yes Specify Current Suicidal Plan: Overdose on Fentanyl Access to Means: Yes Specify Access to Suicidal Means: Pt has access to substances What has been your use of drugs/alcohol within the last 12 months?: Pt drinking alcohol daily Previous Attempts/Gestures: Yes How many times?: 2(History of overdose) Other Self Harm Risks: None Triggers for Past Attempts: Unknown Intentional Self Injurious Behavior: None Family Suicide History: Yes(Aunt and uncle) Recent stressful life event(s): Other (Comment)(Stopped medication) Persecutory voices/beliefs?: No Depression: Yes Depression Symptoms: Despondent, Tearfulness, Isolating, Fatigue, Guilt, Loss of interest in usual pleasures, Feeling worthless/self pity, Feeling angry/irritable Substance abuse history and/or treatment for substance abuse?: Yes Suicide prevention information given to non-admitted patients: Not applicable  Risk to Others within the past 6 months Homicidal Ideation: No Does patient have any lifetime risk of violence toward others beyond the six months prior to admission? : No Thoughts of Harm to Others: No Current Homicidal Intent: No Current Homicidal Plan: No Access to Homicidal Means: No Identified Victim: None History of harm to others?: No Assessment of Violence: None Noted Violent Behavior Description: Pt denies history of violence Does patient have access to weapons?: No Criminal Charges Pending?: Yes Describe Pending Criminal Charges: DUI Does patient have a court date: Yes Court Date: (Unknown) Is patient on probation?: No  Psychosis Hallucinations: None noted Delusions: None noted  Mental Status Report Appearance/Hygiene: Other (Comment)(Casually dressed) Eye Contact: Fair Motor Activity: Unremarkable, Freedom of movement Speech: Logical/coherent Level of Consciousness: Drowsy Mood: Depressed Affect: Depressed, Irritable Anxiety Level: Minimal Thought Processes: Coherent, Relevant Judgement:  Partial Orientation: Person, Place, Time, Situation Obsessive Compulsive Thoughts/Behaviors: None  Cognitive Functioning Concentration: Decreased Memory: Recent Intact, Remote Intact Is patient IDD: No Insight: Fair Impulse Control: Fair Appetite: Fair Have you had any  weight changes? : No Change Sleep: Decreased Total Hours of Sleep: 6 Vegetative Symptoms: Staying in bed  ADLScreening [facility] Assessment Services) Patient's cognitive ability adequate to safely complete daily activities?: Yes Patient able to express need for assistance with ADLs?: Yes Independently performs ADLs?: Yes (appropriate for developmental age)  Prior Inpatient Therapy Prior Inpatient Therapy: Yes Prior Therapy Dates: 2020 Prior Therapy Facilty/Provider(s): [facility] Reason for Treatment: Depression, alcohol  Prior Outpatient Therapy Prior Outpatient Therapy: Yes Prior Therapy Dates: 2020  Prior Therapy Facilty/Provider(s): [facility] Reason for Treatment: MDD Does patient have an ACCT team?: No Does patient have Intensive In-House Services?  : No Does patient have [facility] services? : No Does patient have P4CC services?: No  ADL Screening (condition at time of admission) Patient's cognitive ability adequate to safely complete daily activities?: Yes Is the patient deaf or have difficulty hearing?: No Does the patient have difficulty seeing, even when wearing glasses/contacts?: No Does the patient have difficulty concentrating, remembering, or making decisions?: No Patient able to express need for assistance with ADLs?: Yes Does the patient have difficulty dressing or bathing?: No Independently performs ADLs?: Yes (appropriate for developmental age) Does the patient have difficulty walking or climbing stairs?: No Weakness of Legs: None Weakness of Arms/Hands: None  Home Assistive Devices/Equipment Home Assistive Devices/Equipment: None    Abuse/Neglect Assessment (Assessment to be complete  while patient is alone) Abuse/Neglect Assessment Can Be Completed: Yes Physical Abuse: Denies Verbal Abuse: Denies Sexual Abuse: Denies Exploitation of patient/patient's resources: Denies Self-Neglect: Denies     Merchant navy officer (For Healthcare) Does Patient Have a Medical Advance Directive?: No Would patient like information on creating a medical advance directive?: No - Patient declined          Disposition: Gave clinical information to Nira Conn, FNP who said Pt meets criteria for inpatient psychiatric treatment. Binnie Rail, [facility], confirmed bed will be available after 0800 pending COVID test. Notified Dr Baxter Hire Ward and Clide Cliff, RN of acceptance.   Disposition Initial Assessment Completed for this Encounter: Yes  This service was provided via telemedicine using a 2-way, interactive audio and video technology.  Names of all persons participating in this telemedicine service and their role in this encounter. Name: Roslynn Amble Role: Patient  Name: Shela Commons, [facility] Role: TTS counselor         Harlin Rain Patsy [place], [facility], [facility] Triage Specialist [phone]  Pamalee Leyden 06/11/2019 1:53 AM

## 2019-06-12 LAB — HEMOGLOBIN A1C
Hgb A1c MFr Bld: 5.3 % (ref 4.8–5.6)
Mean Plasma Glucose: 105.41 mg/dL

## 2019-06-12 LAB — LIPID PANEL
Cholesterol: 126 mg/dL (ref 0–200)
HDL: 46 mg/dL (ref >40)
LDL Cholesterol: 36 mg/dL (ref 0–99)
Total CHOL/HDL Ratio: 2.7 RATIO
Triglycerides: 219 mg/dL — ABNORMAL HIGH (ref <150)
VLDL: 44 mg/dL — ABNORMAL HIGH (ref 0–40)

## 2019-06-12 LAB — TSH: TSH: 1.792 u[IU]/mL (ref 0.350–4.500)

## 2019-06-12 NOTE — BHH Group Notes (Signed)
BHH LCSW Group Therapy Note  Date/Time:  06/12/2019 1015  Type of Therapy and Topic:  Group Therapy:  Healthy and Unhealthy Supports  Participation Level:  None   Description of Group:  Patients in this group were introduced to the idea of adding a variety of healthy supports to address the various needs in their lives.Patients discussed what additional healthy supports could be helpful in their recovery and wellness after discharge in order to prevent future hospitalizations.   An emphasis was placed on using counselor, doctor, therapy groups, 12-step groups, and problem-specific support groups to expand supports.  They also worked as a group on developing a specific plan for several patients to deal with unhealthy supports through boundary-setting, psychoeducation with loved ones, and even termination of relationships.   Therapeutic Goals:   1)  discuss importance of adding supports to stay well once out of the hospital  2)  compare healthy versus unhealthy supports and identify some examples of each  3)  generate ideas and descriptions of healthy supports that can be added  4)  offer mutual support about how to address unhealthy supports  5)  encourage active participation in and adherence to discharge plan    Summary of Patient Progress:  The patient reluctantly engaged in check-in, sharing of being "1/10, because the healthcare system is totally fucked because they won't give me my Ritalin and when I'm on it I'm a perfectly functioning adult but when I don't get it I can't and if I knew that I wouldn't have come to this place." Pt did not provide any further input nor engage with any group members in discussion. Pt left group prior to group close without return.   Therapeutic Modalities:   Motivational Interviewing Brief Solution-Focused Therapy  Micheline Maze 06/12/2019  12:59 PM

## 2019-06-12 NOTE — Progress Notes (Signed)
   06/12/19 1950  COVID-19 Daily Checkoff  Have you had a fever (temp > 37.80C/100F)  in the past 24 hours?  No  COVID-19 EXPOSURE  Have you traveled outside the state in the past 14 days? No  Have you been in contact with someone with a confirmed diagnosis of COVID-19 or PUI in the past 14 days without wearing appropriate PPE? No  Have you been living in the same home as a person with confirmed diagnosis of COVID-19 or a PUI (household contact)? No  Have you been diagnosed with COVID-19? No

## 2019-06-12 NOTE — Progress Notes (Signed)
   06/12/19 0002  COVID-19 Daily Checkoff  Have you had a fever (temp > 37.80C/100F)  in the past 24 hours?  No  If you have had runny nose, nasal congestion, sneezing in the past 24 hours, has it worsened? No  COVID-19 EXPOSURE  Have you traveled outside the state in the past 14 days? No  Have you been in contact with someone with a confirmed diagnosis of COVID-19 or PUI in the past 14 days without wearing appropriate PPE? No  Have you been living in the same home as a person with confirmed diagnosis of COVID-19 or a PUI (household contact)? No  Have you been diagnosed with COVID-19? No

## 2019-06-12 NOTE — Progress Notes (Signed)
   06/12/19 0004  Psych Admission Type (Psych Patients Only)  Admission Status Voluntary  Psychosocial Assessment  Patient Complaints None  Eye Contact Brief  Facial Expression Flat  Affect Appropriate to circumstance  Speech Logical/coherent  Interaction Minimal  Motor Activity Other (Comment) (WDL)  Appearance/Hygiene Unremarkable  Behavior Characteristics Appropriate to situation  Mood Depressed  Thought Process  Coherency WDL  Content WDL  Delusions None reported or observed  Perception WDL  Hallucination None reported or observed  Judgment Impaired  Confusion None  Danger to Self  Current suicidal ideation? Denies  Danger to Others  Danger to Others None reported or observed

## 2019-06-12 NOTE — Progress Notes (Signed)
D. Pt has been less irritable today-  but remained in bed for much of the day. Pt rated her depression, hopelessness and anxiety a 9/10/9, respectively. Pt denied SI/HI and A/VH D. A. Labs and vitals monitored. Pt compliant with medications. Pt supported emotionally and encouraged to express concerns and ask questions.   R. Pt remains safe with 15 minute checks. Will continue POC.

## 2019-06-12 NOTE — Progress Notes (Signed)
[facility] MD Progress Note  06/12/2019 2:18 PM Tina Torres  MRN:  [id number]  Subjective: Tina Torres reports, "I'm good. I need to be discharged. I have no depression or anxiety. I feel trapped here".  Objective: Patient is a 49 year old female with a past psychiatric history significant for alcohol dependence, alcohol withdrawal, reported history of alcohol withdrawal seizures and depression who presented to the [facility] emergency department on 06/10/2019 seeking to be restarted on Ritalin. The patient stated that she had been treated with Ritalin, Celexa and Risperdal in the past, but when she followed up with [facility] approximately a year ago they stopped her Ritalin. She stated since then things were horrible. She stated she said the emergency room for 17 hours yesterday to be able to come to the hospital to get Ritalin. Cornesha is seen chart reviewed. The chart findings discussed with the treatment team. She is lying down in her bed. She denies any depression, anxiety or any other issues. She denies any withdrawal symptoms. She says she feels trapped in here & has asked to be discharged. She currently denies any SIHI, AVH, delusional thoughts or paranoia. She does not appear to be responding to any internal stimuli.  Principal Problem: <principal problem not specified>  Diagnosis: Active Problems:   Severe recurrent major depression without psychotic features ([place])  Total Time spent with patient: 25 minutes  Past Psychiatric History: See H&P  Past Medical History: History reviewed. No pertinent past medical history. History reviewed. No pertinent surgical history.  Family History: History reviewed. No pertinent family history.  Family Psychiatric  History: See H&P  Social History:  Social History   Substance and Sexual Activity  Alcohol Use None     Social History   Substance and Sexual Activity  Drug Use Not on file    Social History   Socioeconomic  History  . Marital status: Single    Spouse name: Not on file  . Number of children: Not on file  . Years of education: Not on file  . Highest education level: Not on file  Occupational History  . Not on file  Tobacco Use  . Smoking status: Current Every Day Smoker    Packs/day: 0.50    Years: 3.00    Pack years: 1.50    Types: Cigarettes  . Smokeless tobacco: Never Used  Substance and Sexual Activity  . Alcohol use: Not on file  . Drug use: Not on file  . Sexual activity: Not on file  Other Topics Concern  . Not on file  Social History Narrative  . Not on file   Social Determinants of Health   Financial Resource Strain:   . Difficulty of Paying Living Expenses:   Food Insecurity:   . Worried About Charity fundraiser in the Last Year:   . Arboriculturist in the Last Year:   Transportation Needs:   . Film/video editor (Medical):   Marland Kitchen Lack of Transportation (Non-Medical):   Physical Activity:   . Days of Exercise per Week:   . Minutes of Exercise per Session:   Stress:   . Feeling of Stress :   Social Connections:   . Frequency of Communication with Friends and Family:   . Frequency of Social Gatherings with Friends and Family:   . Attends Religious Services:   . Active Member of Clubs or Organizations:   . Attends Archivist Meetings:   Marland Kitchen Marital Status:    Additional Social  History:   Sleep: Good  Appetite:  Good  Current Medications: Current Facility-Administered Medications  Medication Dose Route Frequency Provider Last Rate Last Admin  . acetaminophen (TYLENOL) tablet 650 mg  650 mg Oral Q6H PRN Nira Conn A, NP      . alum & mag hydroxide-simeth (MAALOX/MYLANTA) 200-200-20 MG/5ML suspension 30 mL  30 mL Oral Q4H PRN Nira Conn A, NP      . chlordiazePOXIDE (LIBRIUM) capsule 25 mg  25 mg Oral QID PRN Antonieta Pert, MD   25 mg at 06/12/19 1156  . citalopram (CELEXA) tablet 10 mg  10 mg Oral Daily Antonieta Pert, MD   10 mg at  06/12/19 0827  . gabapentin (NEURONTIN) capsule 300 mg  300 mg Oral TID Antonieta Pert, MD   300 mg at 06/12/19 1152  . hydrOXYzine (ATARAX/VISTARIL) tablet 25 mg  25 mg Oral TID PRN Nira Conn A, NP   25 mg at 06/12/19 1027  . loperamide (IMODIUM) capsule 2-4 mg  2-4 mg Oral PRN Nira Conn A, NP      . magnesium hydroxide (MILK OF MAGNESIA) suspension 30 mL  30 mL Oral Daily PRN Nira Conn A, NP      . multivitamin with minerals tablet 1 tablet  1 tablet Oral Daily Nira Conn A, NP   1 tablet at 06/12/19 0827  . ondansetron (ZOFRAN-ODT) disintegrating tablet 4 mg  4 mg Oral Q6H PRN Nira Conn A, NP      . risperiDONE (RISPERDAL M-TABS) disintegrating tablet 0.5 mg  0.5 mg Oral Daily Antonieta Pert, MD   0.5 mg at 06/12/19 0827  . risperiDONE (RISPERDAL M-TABS) disintegrating tablet 1 mg  1 mg Oral QHS Antonieta Pert, MD      . thiamine tablet 100 mg  100 mg Oral Daily Nira Conn A, NP   100 mg at 06/12/19 0827  . traZODone (DESYREL) tablet 50 mg  50 mg Oral QHS PRN Jackelyn Poling, NP        Lab Results:  Results for orders placed or performed during the hospital encounter of 06/11/19 (from the past 48 hour(s))  Hemoglobin A1c     Status: None   Collection Time: 06/12/19  6:45 AM  Result Value Ref Range   Hgb A1c MFr Bld 5.3 4.8 - 5.6 %    Comment: (NOTE) Pre diabetes:          5.7%-6.4% Diabetes:              >6.4% Glycemic control for   <7.0% adults with diabetes    Mean Plasma Glucose 105.41 mg/dL    Comment: Performed at [facility], [address] [place], [place] [address]  Lipid panel     Status: Abnormal   Collection Time: 06/12/19  6:45 AM  Result Value Ref Range   Cholesterol 126 0 - 200 mg/dL   Triglycerides 726 (H) <150 mg/dL   HDL 46 >20 mg/dL   Total CHOL/HDL Ratio 2.7 RATIO   VLDL 44 (H) 0 - 40 mg/dL   LDL Cholesterol 36 0 - 99 mg/dL    Comment:        Total Cholesterol/HDL:CHD Risk Coronary Heart Disease Risk Table                      Men   Women  1/2 Average Risk   3.4   3.3  Average Risk  5.0   4.4  2 X Average Risk   9.6   7.1  3 X Average Risk  23.4   11.0        Use the calculated Patient Ratio above and the CHD Risk Table to determine the patient's CHD Risk.        ATP III CLASSIFICATION (LDL):  <100     mg/dL   Optimal  732-202  mg/dL   Near or Above                    Optimal  130-159  mg/dL   Borderline  542-706  mg/dL   High  >237     mg/dL   Very High Performed at [facility], [address] [place], [place] [address]   TSH     Status: None   Collection Time: 06/12/19  6:45 AM  Result Value Ref Range   TSH 1.792 0.350 - 4.500 uIU/mL    Comment: Performed by a 3rd Generation assay with a functional sensitivity of <=0.01 uIU/mL. Performed at [facility], [address] [place], [place] [address]    Blood Alcohol level:  Lab Results  Component Value Date   ETH 195 (H) 06/10/2019   Metabolic Disorder Labs: Lab Results  Component Value Date   HGBA1C 5.3 06/12/2019   MPG 105.41 06/12/2019   No results found for: PROLACTIN Lab Results  Component Value Date   CHOL 126 06/12/2019   TRIG 219 (H) 06/12/2019   HDL 46 06/12/2019   CHOLHDL 2.7 06/12/2019   VLDL 44 (H) 06/12/2019   LDLCALC 36 06/12/2019   Physical Findings: AIMS: Facial and Oral Movements Muscles of Facial Expression: None, normal Lips and Perioral Area: None, normal Jaw: None, normal Tongue: None, normal,Extremity Movements Upper (arms, wrists, hands, fingers): None, normal Lower (legs, knees, ankles, toes): None, normal, Trunk Movements Neck, shoulders, hips: None, normal, Overall Severity Severity of abnormal movements (highest score from questions above): None, normal Incapacitation due to abnormal movements: None, normal Patient's awareness of abnormal movements (rate only patient's report): No Awareness, Dental Status Current problems with teeth and/or dentures?:  No Does patient usually wear dentures?: No  CIWA:  CIWA-Ar Total: 7 COWS:  COWS Total Score: 1  Musculoskeletal: Strength & Muscle Tone: within normal limits Gait & Station: normal Patient leans: N/A  Psychiatric Specialty Exam: Physical Exam  Nursing note and vitals reviewed. Constitutional: She is oriented to person, place, and time.  Cardiovascular: Normal rate.  Respiratory: Effort normal.  Genitourinary:    Genitourinary Comments: Deferred   Musculoskeletal:        General: Normal range of motion.     Cervical back: Normal range of motion.  Neurological: She is alert and oriented to person, place, and time.  Skin: Skin is warm and dry.    Review of Systems  Constitutional: Negative for chills, diaphoresis and fever.  HENT: Negative for congestion, rhinorrhea, sneezing and sore throat.   Eyes: Negative for discharge.  Respiratory: Negative for chest tightness, shortness of breath and wheezing.   Cardiovascular: Negative for chest pain and palpitations.  Gastrointestinal: Negative for diarrhea, nausea and vomiting.  Endocrine: Negative for cold intolerance.  Genitourinary: Negative for difficulty urinating.  Musculoskeletal: Negative for arthralgias and myalgias.  Allergic/Immunologic: Negative for environmental allergies and food allergies.  Neurological: Negative.   Psychiatric/Behavioral: Negative for agitation, behavioral problems, confusion, decreased concentration, dysphoric mood, hallucinations, self-injury, sleep disturbance and suicidal ideas. The patient is not  nervous/anxious and is not hyperactive.     Blood pressure (!) 97/57, pulse 70, temperature 98 F (36.7 C), temperature source Oral, resp. rate 20, height 5\' 3"  (1.6 m), weight 75.3 kg, SpO2 98 %.Body mass index is 29.41 kg/m.  General Appearance: Disheveled  Eye Contact:  Good  Speech:  Normal Rate  Volume:  Increased  Mood:  Irritable  Affect:  Congruent  Thought Process:  Coherent and Descriptions  of Associations: Circumstantial  Orientation:  Full (Time, Place, and Person)  Thought Content:  Logical  Suicidal Thoughts:  Yes.  without intent/plan  Homicidal Thoughts:  No  Memory:  Immediate;   Poor Recent;   Poor Remote;   Poor  Judgement:  Impaired  Insight:  Lacking  Psychomotor Activity:  Increased  Concentration:  Concentration: Fair and Attention Span: Fair  Recall:  of Knowledge:  Fair  Language:  Fair  Akathisia:  Negative  Handed:  Right  AIMS (if indicated):     Assets:  Desire for Improvement Resilience  ADL's:  Intact  Cognition:  WNL    Sleep:  Number of Hours: 6.25   Treatment Plan Summary: Daily contact with patient to assess and evaluate symptoms and progress in treatment and Medication management.  - Continue inpatient hospitalization. - Will continue today 06/12/2019 plan as below except where it is noted.  Alcohol withdrawal symptoms.     - Continue Librium 25 mg po qid prn. Depression.     - Continue Citalopram 10 mg po daily Anxiety/agitation.     - Continue gabapentin 300 mg po tid.     - Continue Vistaril 25 mg po tid prn. Mood control.     - Continue Risperdal -ODT 0.5 mg po daily.     - Continue Risperdal-ODT 1 mg po Q bedtime. Insomnia.      - Continue Trazodone 50 mg po prn Q hs. Continue Zofran-ODT 4 mg po Q 6 hrs prn for nausea.  Encourage group participation. Discharge disposition plan ongoing.  08/12/2019, NP, PMHNP, FNP-BC. 06/12/2019, 2:18 PM

## 2019-06-12 NOTE — Progress Notes (Signed)
[facility] Group Notes:  (Nursing/MHT/Case Management/Adjunct)  Date:  06/12/2019  Time:  2000 Type of Therapy:  wrap up group  Participation Level:  Minimal  Participation Quality:  Attentive and Resistant  Affect:  Depressed, Irritable and Resistant  Cognitive:  Appropriate  Insight:  Improving  Engagement in Group:  Limited  Modes of Intervention:  Clarification, Education and Support  Summary of Progress/Problems: Positive thinking and positive change discussed. Pt was irritable reporting she isn't getting the medicine that works for her and wants to find a doctor on the outside who will prescribe it. Pt did not name medicine.   Johann Capers S 06/12/2019, 9:06 PM

## 2019-06-13 DIAGNOSIS — F1994 Other psychoactive substance use, unspecified with psychoactive substance-induced mood disorder: Secondary | ICD-10-CM

## 2019-06-13 DIAGNOSIS — F1024 Alcohol dependence with alcohol-induced mood disorder: Secondary | ICD-10-CM

## 2019-06-13 MED ORDER — HYDROXYZINE HCL 25 MG PO TABS: 25.0000 mg | ORAL_TABLET | Freq: Three times a day (TID) | ORAL | 0 refills | Status: DC | PRN

## 2019-06-13 MED ORDER — CITALOPRAM HYDROBROMIDE 10 MG PO TABS: 10.0000 mg | ORAL_TABLET | Freq: Every day | ORAL | 0 refills | Status: DC

## 2019-06-13 MED ORDER — GABAPENTIN 300 MG PO CAPS: 300.0000 mg | ORAL_CAPSULE | Freq: Three times a day (TID) | ORAL | 0 refills | Status: DC

## 2019-06-13 MED ORDER — RISPERIDONE 1 MG PO TBDP: 1.0000 mg | ORAL_TABLET | Freq: Every day | ORAL | 0 refills | Status: DC

## 2019-06-13 MED ORDER — RISPERIDONE 0.5 MG PO TBDP: 0.5000 mg | ORAL_TABLET | Freq: Every morning | ORAL | 0 refills | Status: DC

## 2019-06-13 NOTE — [facility] Suicide Risk Assessment (Addendum)
[facility] INPATIENT:  Family/Significant Other Suicide Prevention Education  Suicide Prevention Education:  Contact Attempts:Jenn[facility] resident, [phone] , has been identified by the patient as the family member/significant other with whom the patient will be residing, and identified as the person(s) who will aid the patient in the event of a mental health crisis.  With written consent from the patient, two attempts were made to provide suicide prevention education, prior to and/or following the patient's discharge.  We were unsuccessful in providing suicide prevention education.  A suicide education pamphlet was given to the patient to share with family/significant other.  Date and time of first attempt:06/13/19, 1109 Date and time of second attempt:  Lorri Frederick, LCSW 06/13/2019, 11:09 AM

## 2019-06-13 NOTE — Discharge Summary (Signed)
Physician Discharge Summary Note  Patient:  Tina Torres is an 49 y.o., female MRN:  [id number] DOB:  06-28-1970 Patient phone:  [phone] (home)  Patient address:   [address] [place] [address],  Total Time spent with patient: 30 minutes  Date of Admission:  06/11/2019 Date of Discharge: 06/13/19  Reason for Admission:  49 year old female with a past psychiatric history significant for alcohol dependence, alcohol withdrawal, reported history of alcohol withdrawal seizures and depression who presented to the [facility] emergency department on 06/10/2019 seeking to be restarted on Ritalin. The patient stated that she had been treated with Ritalin, Celexa and Risperdal in the past, but when she followed up with [facility] approximately a year ago they stopped her Ritalin. She stated since then things were horrible.   Principal Problem: <principal problem not specified> Discharge Diagnoses: Active Problems:   Severe recurrent major depression without psychotic features [facility])   Past Psychiatric History: She has been seen multiple times since 2018 in various emergency rooms in the area.  Her last 2 psychiatric hospitalizations were at [facility]  There was a hospitalization on 09/24/2016.  She was placed on Celexa, Topamax and Neurontin at that time.  She had been previously treated with Vraylar by her report.  Past Medical History: History reviewed. No pertinent past medical history. History reviewed. No pertinent surgical history. Family History: History reviewed. No pertinent family history. Family Psychiatric  History: None reported Social History:  Social History   Substance and Sexual Activity  Alcohol Use None     Social History   Substance and Sexual Activity  Drug Use Not on file    Social History   Socioeconomic History   Marital status: Single    Spouse name: Not on file   Number of children: Not on file   Years of  education: Not on file   Highest education level: Not on file  Occupational History   Not on file  Tobacco Use   Smoking status: Current Every Day Smoker    Packs/day: 0.50    Years: 3.00    Pack years: 1.50    Types: Cigarettes   Smokeless tobacco: Never Used  Substance and Sexual Activity   Alcohol use: Not on file   Drug use: Not on file   Sexual activity: Not on file  Other Topics Concern   Not on file  Social History Narrative   Not on file   Social Determinants of Health   Financial Resource Strain:    Difficulty of Paying Living Expenses:   Food Insecurity:    Worried About Programme researcher, broadcasting/film/video in the Last Year:    Barista in the Last Year:   Transportation Needs:    Freight forwarder (Medical):    Lack of Transportation (Non-Medical):   Physical Activity:    Days of Exercise per Week:    Minutes of Exercise per Session:   Stress:    Feeling of Stress :   Social Connections:    Frequency of Communication with Friends and Family:    Frequency of Social Gatherings with Friends and Family:    Attends Religious Services:    Active Member of Clubs or Organizations:    Attends Banker Meetings:    Marital Status:     Hospital Course:  Patient remained on the [facility] unit for 2 days. The patient stabilized on medication and therapy. Patient was discharged on Celexa 10 mg  Daily, Neurontin 300 mg TID, Vistaril 25 mg TID PRN, Risperdal 0.5 mg Daily and 1 mg QHS, and Trazodone 50 mg QHS PRN. Patient has shown improvement with improved mood, affect, sleep, appetite, and interaction. Patient has attended group and participated. Patient has been seen in the day room interacting with peers and staff appropriately. Patient denies any SI/HI/AVH and contracts for safety. Patient agrees to follow up at [facility]. Patient is provided with prescriptions for their medications upon discharge.  Physical  Findings: AIMS: Facial and Oral Movements Muscles of Facial Expression: None, normal Lips and Perioral Area: None, normal Jaw: None, normal Tongue: None, normal,Extremity Movements Upper (arms, wrists, hands, fingers): None, normal Lower (legs, knees, ankles, toes): None, normal, Trunk Movements Neck, shoulders, hips: None, normal, Overall Severity Severity of abnormal movements (highest score from questions above): None, normal Incapacitation due to abnormal movements: None, normal Patient's awareness of abnormal movements (rate only patient's report): No Awareness, Dental Status Current problems with teeth and/or dentures?: No Does patient usually wear dentures?: No  CIWA:  CIWA-Ar Total: 1 COWS:  COWS Total Score: 1  Musculoskeletal: Strength & Muscle Tone: within normal limits Gait & Station: normal Patient leans: N/A  Psychiatric Specialty Exam: Physical Exam  Nursing note and vitals reviewed. Constitutional: She is oriented to person, place, and time. She appears well-developed and well-nourished.  Cardiovascular: Normal rate.  Respiratory: Effort normal.  Musculoskeletal:        General: Normal range of motion.  Neurological: She is alert and oriented to person, place, and time.  Skin: Skin is warm.    Review of Systems  Constitutional: Negative.   HENT: Negative.   Eyes: Negative.   Respiratory: Negative.   Cardiovascular: Negative.   Gastrointestinal: Negative.   Genitourinary: Negative.   Musculoskeletal: Negative.   Skin: Negative.   Neurological: Negative.   Psychiatric/Behavioral: Negative.     Blood pressure 112/68, pulse 76, temperature 98 F (36.7 C), temperature source Oral, resp. rate 20, height 5\' 3"  (1.6 m), weight 75.3 kg, SpO2 98 %.Body mass index is 29.41 kg/m.   General Appearance: Disheveled  Eye Sport and exercise psychologist::  Fair  Speech:  Normal Rate409  Volume:  Normal  Mood:  Anxious  Affect:  Congruent  Thought Process:  Coherent and Descriptions of  Associations: Intact  Orientation:  Full (Time, Place, and Person)  Thought Content:  Logical  Suicidal Thoughts:  No  Homicidal Thoughts:  No  Memory:  Immediate;   Fair Recent;   Fair Remote;   Fair  Judgement:  Impaired  Insight:  Lacking  Psychomotor Activity:  Normal  Concentration:  Fair  Recall:  [employer] of Knowledge:Fair  Language: Fair  Akathisia:  Negative  Handed:  Right  AIMS (if indicated):     Assets:  Desire for Improvement Resilience  Sleep:  Number of Hours: 6.25  Cognition: WNL  ADL's:  Intact      Has this patient used any form of tobacco in the last 30 days? (Cigarettes, Smokeless Tobacco, Cigars, and/or Pipes) Yes, Yes, A prescription for an FDA-approved tobacco cessation medication was offered at discharge and the patient refused  Blood Alcohol level:  Lab Results  Component Value Date   ETH 195 (H) 60/63/0160    Metabolic Disorder Labs:  Lab Results  Component Value Date   HGBA1C 5.3 06/12/2019   MPG 105.41 06/12/2019   No results found for: PROLACTIN Lab Results  Component Value Date   CHOL 126 06/12/2019  TRIG 219 (H) 06/12/2019   HDL 46 06/12/2019   CHOLHDL 2.7 06/12/2019   VLDL 44 (H) 06/12/2019   LDLCALC 36 06/12/2019    See Psychiatric Specialty Exam and Suicide Risk Assessment completed by Attending Physician prior to discharge.  Discharge destination:  Home  Is patient on multiple antipsychotic therapies at discharge:  No   Has Patient had three or more failed trials of antipsychotic monotherapy by history:  No  Recommended Plan for Multiple Antipsychotic Therapies: NA  Discharge Instructions    Discharge instructions   Complete by: As directed    Patient is instructed to take all prescribed medications as recommended. Report any side effects or adverse reactions to your outpatient psychiatrist. Patient is instructed to abstain from alcohol and illegal drugs while on prescription medications. In the event of  worsening symptoms, patient is instructed to call the crisis hotline, 911, or go to the nearest emergency department for evaluation and treatment.     Allergies as of 06/13/2019   No Known Allergies     Medication List    STOP taking these medications   ciprofloxacin 500 MG tablet Commonly known as: CIPRO   methocarbamol 500 MG tablet Commonly known as: ROBAXIN   naproxen 500 MG tablet Commonly known as: NAPROSYN     TAKE these medications     Indication  citalopram 10 MG tablet Commonly known as: CELEXA Take 1 tablet (10 mg total) by mouth daily. Start taking on: June 14, 2019  Indication: Depression   gabapentin 300 MG capsule Commonly known as: NEURONTIN Take 1 capsule (300 mg total) by mouth 3 (three) times daily.  Indication: Neuropathic Pain   hydrOXYzine 25 MG tablet Commonly known as: ATARAX/VISTARIL Take 1 tablet (25 mg total) by mouth 3 (three) times daily as needed for anxiety.  Indication: Feeling Anxious   risperiDONE 0.5 MG disintegrating tablet Commonly known as: RISPERDAL M-TABS Take 1 tablet (0.5 mg total) by mouth in the morning.  Indication: Major Depressive Disorder   risperiDONE 1 MG disintegrating tablet Commonly known as: RISPERDAL M-TABS Take 1 tablet (1 mg total) by mouth at bedtime.  Indication: Major Depressive Disorder      Follow-up Information    [facility]. Go on 06/20/2019.   Specialty: Behavioral Health Why: Your appointment for therapy is Monday, 06/20/19 Marybelle Killings at 11am. Your appointment for medication management is Thursday, 06/30/19 at 10:00am with Dr [name], NP. Be sure to provide any discharge paperwork from this hospitalization.  Contact information: [address] [place] [address] [phone]       [facility] Follow up.   Why: If interested in SAIOP (substance abuse intensive outpatient) services, please go to agency during their walk-in hours.  Walk-in hours are Monday- Friday from 7:00am-10:00am. Be sure to provide any discharge paperwork.  Contact information: [address], [place], [place] [address]  Telephone: [phone] Fax: [phone]          Follow-up recommendations:  Continue activity as tolerated. Continue diet as recommended by your PCP. Ensure to keep all appointments with outpatient providers.  Comments:  Patient is instructed prior to discharge to: Take all medications as prescribed by his/her mental healthcare provider. Report any adverse effects and or reactions from the medicines to his/her outpatient provider promptly. Patient has been instructed & cautioned: To not engage in alcohol and or illegal drug use while on prescription medicines. In the event of worsening symptoms, patient is instructed to call  the crisis hotline, 911 and or go to the nearest ED for appropriate evaluation and treatment of symptoms. To follow-up with his/her primary care provider for your other medical issues, concerns and or health care needs.    Signed: Gerlene Burdock Kenyan Karnes, FNP 06/13/2019, 2:11 PM

## 2019-06-13 NOTE — [facility] Suicide Risk Assessment (Signed)
[facility] Discharge Suicide Risk Assessment   Principal Problem: <principal problem not specified> Discharge Diagnoses: Active Problems:   Severe recurrent major depression without psychotic features (HCC)   Total Time spent with patient: 15 minutes  Musculoskeletal: Strength & Muscle Tone: within normal limits Gait & Station: normal Patient leans: N/A  Psychiatric Specialty Exam: Review of Systems  All other systems reviewed and are negative.   Blood pressure 112/68, pulse 76, temperature 98 F (36.7 C), temperature source Oral, resp. rate 20, height 5\' 3"  (1.6 m), weight 75.3 kg, SpO2 98 %.Body mass index is 29.41 kg/m.  General Appearance: Disheveled  Eye ::  Fair  Speech:  Normal Rate409  Volume:  Normal  Mood:  Anxious  Affect:  Congruent  Thought Process:  Coherent and Descriptions of Associations: Intact  Orientation:  Full (Time, Place, and Person)  Thought Content:  Logical  Suicidal Thoughts:  No  Homicidal Thoughts:  No  Memory:  Immediate;   Fair Recent;   Fair Remote;   Fair  Judgement:  Impaired  Insight:  Lacking  Psychomotor Activity:  Normal  Concentration:  Fair  Recall:  [phone] of Knowledge:Fair  Language: Fair  Akathisia:  Negative  Handed:  Right  AIMS (if indicated):     Assets:  Desire for Improvement Resilience  Sleep:  Number of Hours: 6.25  Cognition: WNL  ADL's:  Intact   Mental Status Per Nursing Assessment::   On Admission:  Suicidal ideation indicated by patient  Demographic Factors:  Divorced or widowed, Caucasian, Low socioeconomic status, Living alone and Unemployed  Loss Factors: NA  Historical Factors: Impulsivity  Risk Reduction Factors:   NA  Continued Clinical Symptoms:  Depression:   Comorbid alcohol abuse/dependence Alcohol/Substance Abuse/Dependencies  Cognitive Features That Contribute To Risk:  None    Suicide Risk:  Minimal: No identifiable suicidal ideation.  Patients presenting with no risk  factors but with morbid ruminations; may be classified as minimal risk based on the severity of the depressive symptoms    Plan Of Care/Follow-up recommendations:  Activity:  ad lib  [phone], MD 06/13/2019, 8:14 AM

## 2019-06-13 NOTE — [facility] Suicide Risk Assessment (Signed)
[facility] INPATIENT:  Family/Significant Other Suicide Prevention Education  Suicide Prevention Education:  Education Completed; Baruch Goldmann, [employer] resident, [phone], has been identified by the patient as the family member/significant other with whom the patient will be residing, and identified as the person(s) who will aid the patient in the event of a mental health crisis (suicidal ideations/suicide attempt).  With written consent from the patient, the family member/significant other has been provided the following suicide prevention education, prior to the and/or following the discharge of the patient.  The suicide prevention education provided includes the following:  Suicide risk factors  Suicide prevention and interventions  National Suicide Hotline telephone number  [facility] assessment telephone number  [facility] Emergency Assistance 911  [facility] and/or Residential Mobile Crisis Unit telephone number  Request made of family/significant other to:  Remove weapons (e.g., guns, rifles, knives), all items previously/currently identified as safety concern.  No guns in the home allowed, per Jettie Booze drugs/medications (over-the-counter, prescriptions, illicit drugs), all items previously/currently identified as a safety concern. Pt does have her own medication at [facility] house unless it is a narcotic.   The family member/significant other verbalizes understanding of the suicide prevention education information provided.  The family member/significant other agrees to remove the items of safety concern listed above.  Victorino Dike reports that she agrees that pt is in the right program at [facility] and does not need referral to residential treatment currently.  Pt is welcomed back and Victorino Dike can be available to pick her up if needed today at discharge.   Lorri Frederick 06/13/2019, 1:40 PM

## 2019-06-13 NOTE — Progress Notes (Signed)
Discharge Note:  Patient discharged to her boarding house where she lives with friends.  Patient denied SI and HI.  Denied A/V hallucinations.  Denied pain.  Suicide prevention information given and discussed with patient who stated she understood and had no questions.  Patient stated she received all her belongings, clothing, toiletries, misc items, etc.  Patient stated she appreciated all assistance received from [facility] staff.  All required discharge instructions given at discharge.

## 2019-06-13 NOTE — Tx Team (Signed)
Interdisciplinary Treatment and Diagnostic Plan Update  06/13/2019 Time of Session: 9:00AM Tina Torres MRN: [id number]  Principal Diagnosis: <principal problem not specified>  Secondary Diagnoses: Active Problems:   Severe recurrent major depression without psychotic features (HCC)   Current Medications:  Current Facility-Administered Medications  Medication Dose Route Frequency Provider Last Rate Last Admin  . acetaminophen (TYLENOL) tablet 650 mg  650 mg Oral Q6H PRN Nira Conn A, NP   650 mg at 06/12/19 2020  . alum & mag hydroxide-simeth (MAALOX/MYLANTA) 200-200-20 MG/5ML suspension 30 mL  30 mL Oral Q4H PRN Nira Conn A, NP      . chlordiazePOXIDE (LIBRIUM) capsule 25 mg  25 mg Oral QID PRN Antonieta Pert, MD   25 mg at 06/12/19 1905  . citalopram (CELEXA) tablet 10 mg  10 mg Oral Daily Antonieta Pert, MD   10 mg at 06/13/19 0804  . gabapentin (NEURONTIN) capsule 300 mg  300 mg Oral TID Antonieta Pert, MD   300 mg at 06/13/19 0804  . hydrOXYzine (ATARAX/VISTARIL) tablet 25 mg  25 mg Oral TID PRN Nira Conn A, NP   25 mg at 06/12/19 2020  . loperamide (IMODIUM) capsule 2-4 mg  2-4 mg Oral PRN Nira Conn A, NP      . magnesium hydroxide (MILK OF MAGNESIA) suspension 30 mL  30 mL Oral Daily PRN Nira Conn A, NP      . multivitamin with minerals tablet 1 tablet  1 tablet Oral Daily Nira Conn A, NP   1 tablet at 06/13/19 0804  . ondansetron (ZOFRAN-ODT) disintegrating tablet 4 mg  4 mg Oral Q6H PRN Nira Conn A, NP   4 mg at 06/12/19 1905  . risperiDONE (RISPERDAL M-TABS) disintegrating tablet 0.5 mg  0.5 mg Oral Daily Antonieta Pert, MD   0.5 mg at 06/13/19 0805  . risperiDONE (RISPERDAL M-TABS) disintegrating tablet 1 mg  1 mg Oral QHS Antonieta Pert, MD   1 mg at 06/12/19 2253  . thiamine tablet 100 mg  100 mg Oral Daily Nira Conn A, NP   100 mg at 06/13/19 0804  . traZODone (DESYREL) tablet 50 mg  50 mg Oral QHS PRN Nira Conn A, NP   50  mg at 06/12/19 2020   PTA Medications: Medications Prior to Admission  Medication Sig Dispense Refill Last Dose  . ciprofloxacin (CIPRO) 500 MG tablet Take 1 tablet (500 mg total) by mouth every 12 (twelve) hours. 14 tablet 0   . methocarbamol (ROBAXIN) 500 MG tablet Take 1 tablet (500 mg total) by mouth every 8 (eight) hours as needed for muscle spasms. 15 tablet 0   . naproxen (NAPROSYN) 500 MG tablet Take 1 tablet (500 mg total) by mouth 2 (two) times daily. 10 tablet 0     Patient Stressors: Financial difficulties Medication change or noncompliance Substance abuse  Patient Strengths: Average or above average intelligence Capable of independent living Communication skills Physical Health  Treatment Modalities: Medication Management, Group therapy, Case management,  1 to 1 session with clinician, Psychoeducation, Recreational therapy.   Physician Treatment Plan for Primary Diagnosis: <principal problem not specified> Long Term Goal(s): Improvement in symptoms so as ready for discharge Improvement in symptoms so as ready for discharge   Short Term Goals: Ability to identify changes in lifestyle to reduce recurrence of condition will improve Ability to verbalize feelings will improve Ability to disclose and discuss suicidal ideas Ability to demonstrate self-control will improve Ability to identify and  develop effective coping behaviors will improve Ability to maintain clinical measurements within normal limits will improve Compliance with prescribed medications will improve Ability to identify triggers associated with substance abuse/mental health issues will improve Ability to identify changes in lifestyle to reduce recurrence of condition will improve Ability to verbalize feelings will improve Ability to disclose and discuss suicidal ideas Ability to demonstrate self-control will improve Ability to identify and develop effective coping behaviors will improve Ability to  maintain clinical measurements within normal limits will improve Compliance with prescribed medications will improve Ability to identify triggers associated with substance abuse/mental health issues will improve  Medication Management: Evaluate patient's response, side effects, and tolerance of medication regimen.  Therapeutic Interventions: 1 to 1 sessions, Unit Group sessions and Medication administration.  Evaluation of Outcomes: Adequate for Discharge  Physician Treatment Plan for Secondary Diagnosis: Active Problems:   Severe recurrent major depression without psychotic features (HCC)  Long Term Goal(s): Improvement in symptoms so as ready for discharge Improvement in symptoms so as ready for discharge   Short Term Goals: Ability to identify changes in lifestyle to reduce recurrence of condition will improve Ability to verbalize feelings will improve Ability to disclose and discuss suicidal ideas Ability to demonstrate self-control will improve Ability to identify and develop effective coping behaviors will improve Ability to maintain clinical measurements within normal limits will improve Compliance with prescribed medications will improve Ability to identify triggers associated with substance abuse/mental health issues will improve Ability to identify changes in lifestyle to reduce recurrence of condition will improve Ability to verbalize feelings will improve Ability to disclose and discuss suicidal ideas Ability to demonstrate self-control will improve Ability to identify and develop effective coping behaviors will improve Ability to maintain clinical measurements within normal limits will improve Compliance with prescribed medications will improve Ability to identify triggers associated with substance abuse/mental health issues will improve     Medication Management: Evaluate patient's response, side effects, and tolerance of medication regimen.  Therapeutic  Interventions: 1 to 1 sessions, Unit Group sessions and Medication administration.  Evaluation of Outcomes: Adequate for Discharge   RN Treatment Plan for Primary Diagnosis: <principal problem not specified> Long Term Goal(s): Knowledge of disease and therapeutic regimen to maintain health will improve  Short Term Goals: Ability to demonstrate self-control and Ability to identify and develop effective coping behaviors will improve  Medication Management: RN will administer medications as ordered by provider, will assess and evaluate patient's response and provide education to patient for prescribed medication. RN will report any adverse and/or side effects to prescribing provider.  Therapeutic Interventions: 1 on 1 counseling sessions, Psychoeducation, Medication administration, Evaluate responses to treatment, Monitor vital signs and CBGs as ordered, Perform/monitor CIWA, COWS, AIMS and Fall Risk screenings as ordered, Perform wound care treatments as ordered.  Evaluation of Outcomes: Adequate for Discharge   LCSW Treatment Plan for Primary Diagnosis: <principal problem not specified> Long Term Goal(s): Safe transition to appropriate next level of care at discharge, Engage patient in therapeutic group addressing interpersonal concerns.  Short Term Goals: Engage patient in aftercare planning with referrals and resources, Increase social support, Increase emotional regulation and Facilitate acceptance of mental health diagnosis and concerns  Therapeutic Interventions: Assess for all discharge needs, 1 to 1 time with Social worker, Explore available resources and support systems, Assess for adequacy in community support network, Educate family and significant other(s) on suicide prevention, Complete Psychosocial Assessment, Interpersonal group therapy.  Evaluation of Outcomes: Adequate for Discharge   Progress in Treatment:  Attending groups: Yes. Participating in groups: Yes. Taking  medication as prescribed: Yes. Med seeking Toleration medication: Yes. Family/Significant other contact made: No, will contact:  SPE reviewed with patient Patient understands diagnosis: Yes. Discussing patient identified problems/goals with staff: Yes. Medical problems stabilized or resolved: Yes. Denies suicidal/homicidal ideation: Yes. Issues/concerns per patient self-inventory: Yes.  New problem(s) identified: Yes, Describe:  financial stressors.  New Short Term/Long Term Goal(s): detox, medication management for mood stabilization; elimination of SI thoughts; development of comprehensive mental wellness/sobriety plan.  Patient Goals: "Get back on the meds I was taking. Finding someone to prescribe me adderall."  Discharge Plan or Barriers: returning home, referred to [facility] for outpatient medication management and therapy  Reason for Continuation of Hospitalization: Anxiety  Estimated Length of Stay: discharging today  Attendees: Patient: Tina Torres 06/13/2019 11:04 AM  Physician:  06/13/2019 11:04 AM  Nursing:  06/13/2019 11:04 AM  RN Care Manager: 06/13/2019 11:04 AM  Social Worker: Stephanie Acre, [place] 06/13/2019 11:04 AM  Recreational Therapist:  06/13/2019 11:04 AM  Other: Harriett Sine, NP 06/13/2019 11:04 AM  Other:  06/13/2019 11:04 AM  Other: 06/13/2019 11:04 AM    Scribe for Treatment Team: Joellen Jersey, [place] 06/13/2019 11:04 AM

## 2019-06-13 NOTE — Progress Notes (Signed)
Pt appeared very irritable upon assessment and verbalized that her goal is to "get the fuck out of here." Pt is upset that they're not able to prescribe her Ritalin here for her ADHD. She said that she had attended group today but it was "pathetic." Pt said that "there's not much to do here." Active listening, reassurance, and support provided. 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 She reports having been "clean for 60 days" before she relapsed since she couldn't get a refill on her Ritalin. Pt said "why don't ya'll give me a shot of thorazine." Pt c/o of anxiety and trouble sleeping for which her Vistaril and Trazadone were administered. Pt appeared calm and pleasant after administration. Pt has been resting in bed with no further complaints. She denies SI/HI and AVH. Q 15 minute safety checks continue.    06/12/19 1950  Psych Admission Type (Psych Patients Only)  Admission Status Voluntary  Psychosocial Assessment  Patient Complaints Anger;Anxiety;Insomnia;Irritability;Depression;Isolation  Eye Contact Fair  Facial Expression Anxious;Sad  Affect Anxious;Depressed;Irritable  Speech Logical/coherent  Interaction Assertive;Demanding  Motor Activity Other (Comment) (WDL)  Appearance/Hygiene Unremarkable  Behavior Characteristics Anxious;Irritable;Calm  Mood Depressed;Anxious;Irritable  Thought Process  Coherency WDL  Content Blaming others;Preoccupation  Delusions None reported or observed  Perception WDL  Hallucination None reported or observed  Judgment Impaired  Confusion None  Danger to Self  Current suicidal ideation? Denies  Danger to Others  Danger to Others None reported or observed

## 2019-06-13 NOTE — Progress Notes (Signed)
  [facility] Adult Case Management Discharge Plan :  Will you be returning to the same living situation after discharge:  Yes,  patient plans to return to boarding home with roommates At discharge, do you have transportation home?: Yes,  CSW arranging Safe Transport Do you have the ability to pay for your medications: No.  Release of information consent forms completed and in the chart;  Patient's signature needed at discharge.  Patient to Follow up at: Follow-up Information    [facility]. Go on 06/20/2019.   Specialty: Behavioral Health Why: Your appointment for therapy is Monday, 06/20/19 Marybelle Killings at 11am. Your appointment for medication management is Thursday, 06/30/19 at 10:00am with Dr [name], NP. Be sure to provide any discharge paperwork from this hospitalization.  Contact information: [address] [place] [address] [phone]       [facility] Follow [facility].   Why: If interested in SAIOP (substance abuse intensive outpatient) services, please go to agency during their walk-in hours. Walk-in hours are Monday- Friday from 7:00am-10:00am. Be sure to provide any discharge paperwork.  Contact information: [address] [place], [place] [address]  Telephone: [phone] Fax: [phone]          Next level of care provider has access to [facility] Link:yes  Safety Planning and Suicide Prevention discussed: Yes,  with the patient     Has patient been referred to the Quitline?: Patient refused referral  Patient has been referred for addiction treatment: Pt. refused referral  Maeola Sarah, LCSWA 06/13/2019, 11:40 AM

## 2019-06-13 NOTE — Progress Notes (Signed)
Recreation Therapy Notes  Date:  6.7.21 Time: 0930 Location: [address] Group Room  Group Topic: Stress Management  Goal Area(s) Addresses:  Patient will identify positive stress management techniques. Patient will identify benefits of using stress management post d/c.  Intervention: Stress Management  Activity: Meditation.  LRT played a meditation that focused on embracing change.  Patients were to listen and follow along as meditation was read to engage in activity.    Education:  Stress Management, Discharge Planning.   Education Outcome: Acknowledges Education  Clinical Observations/Feedback: Pt did not attend activity.    Haydon Dorris, LRT/CTRS         Cintya Daughety A 06/13/2019 11:10 AM 

## 2019-06-13 NOTE — Progress Notes (Addendum)
   06/13/19 0800  Psych Admission Type (Psych Patients Only)  Admission Status Voluntary  Psychosocial Assessment  Patient Complaints Anxiety;Depression  Eye Contact Fair  Facial Expression Anxious;Sad  Affect Anxious;Depressed;Irritable  Speech Logical/coherent  Interaction Assertive  Motor Activity Other (Comment) (WDL)  Appearance/Hygiene Unremarkable  Behavior Characteristics Cooperative;Anxious  Mood Depressed  Thought Process  Coherency WDL  Content Blaming others;Preoccupation  Delusions None reported or observed  Perception WDL  Hallucination None reported or observed  Judgment Impaired  Confusion None  Danger to Self  Current suicidal ideation? Denies  Danger to Others  Danger to Others None reported or observed

## 2019-06-14 NOTE — Progress Notes (Signed)
Spiritual care group on grief and loss facilitated by chaplain Emmilia Sowder MDiv, B[facility]  Group Goal:  Support / Education around grief and loss Members engage in facilitated group support and psycho-social education.  Group Description:  Following introductions and group rules, group members engaged in facilitated group dialog and support around topic of loss, with particular support around experiences of loss in their lives. Group Identified types of loss (relationships / self / things) and identified patterns, circumstances, and changes that precipitate losses. Reflected on thoughts / feelings around loss, normalized grief responses, and recognized variety in grief experience.   Group noted Worden's four tasks of grief in discussion.  Group drew on Adlerian / Rogerian, narrative, MI, Patient Progress:   Did not attend  

## 2019-06-20 ENCOUNTER — Ambulatory Visit (HOSPITAL_COMMUNITY): Payer: Federal, State, Local not specified - Other | Admitting: Licensed Clinical Social Worker

## 2019-06-30 ENCOUNTER — Other Ambulatory Visit: Payer: Self-pay

## 2019-06-30 ENCOUNTER — Ambulatory Visit (INDEPENDENT_AMBULATORY_CARE_PROVIDER_SITE_OTHER): Payer: Federal, State, Local not specified - Other | Admitting: Psychiatry

## 2019-06-30 ENCOUNTER — Encounter (HOSPITAL_COMMUNITY): Payer: Self-pay | Admitting: Psychiatry

## 2019-06-30 DIAGNOSIS — F332 Major depressive disorder, recurrent severe without psychotic features: Secondary | ICD-10-CM

## 2019-06-30 MED ORDER — GABAPENTIN 300 MG PO CAPS: 300.0000 mg | ORAL_CAPSULE | Freq: Three times a day (TID) | ORAL | 1 refills | Status: DC

## 2019-06-30 MED ORDER — CITALOPRAM HYDROBROMIDE 20 MG PO TABS: 20.0000 mg | ORAL_TABLET | Freq: Every day | ORAL | 1 refills | Status: DC

## 2019-06-30 MED ORDER — HYDROXYZINE HCL 25 MG PO TABS: 25.0000 mg | ORAL_TABLET | Freq: Three times a day (TID) | ORAL | 1 refills | Status: DC | PRN

## 2019-06-30 NOTE — Progress Notes (Signed)
Psychiatric Initial Adult Assessment   Patient Identification: Tina Torres MRN:  [id number] Date of Evaluation:  06/30/2019 Referral Source: [facility] Chief Complaint:  "I have problems focusing" Visit Diagnosis:    ICD-10-CM   1. Severe recurrent major depression without psychotic features (HCC)  F33.2 citalopram (CELEXA) 20 MG tablet    gabapentin (NEURONTIN) 300 MG capsule    hydrOXYzine (ATARAX/VISTARIL) 25 MG tablet    History of Present Illness: 49 year old female seen today for initial psychiatric evaluation.  Patient was referred to outpatient psychiatry for follow-up evaluation after being discharged from the [facility].  She has a psychiatric history of depression and alcohol use disorder.  She is currently being managed on Celexa 10 mg daily, gabapentin 300 mg 3 times daily, Risperdal 0.5 mg in the morning, and Risperdal 1 mg at bedtime.  She reported that she has not taken the Risperdal since being discharged from the hospital because it caused her to have excessive hunger and notes she was unaware of why she was taking it.   Today she notes that her current medication regimen are somewhat effective in managing her psychiatric conditions.  She notes that since restarting her medications she has begun to do things that she once enjoyed such as gardening, playing the [employer], playing softball, and being more active and NA.    She also reports that she has been having problems focusing.  She notes at times she feels like she has to complete  everything task at the same time which becomes overwhelming.  Patient notes that she found Adderall, Ritalin, and Metformin effective in the past to help her focus.  Writer informed patient that Adderall and Ritalin would not be prescribed at this time.  She was also informed that Metformin is a medication that is given to control diabetes and generally not given for attention.  Patient was offered Strattera to help improve attention/concentration however  she reports that Wilhemena Durie is too expensive and is been ineffective in the past.  Writer then suggested that Celexa be increased from 10 mg to 20 mg to help improve problems with focus and concentration.  She endorsed understanding and agreed to medication recommendation. No other concerns noted at this time.  Associated Signs/Symptoms: Depression Symptoms:  difficulty concentrating, (Hypo) Manic Symptoms:  Flight of Ideas, Anxiety Symptoms:  Denies Psychotic Symptoms:  Denies PTSD Symptoms: NA  Past Psychiatric History: Depression and Alcohol use disorder  Previous Psychotropic Medications: Yes   Substance Abuse History in the last 12 months:  Yes.    Consequences of Substance Abuse: Legal Consequences:  Pending DUI  Past Medical History: History reviewed. No pertinent past medical history. History reviewed. No pertinent surgical history.  Family Psychiatric History: Sister Bipolar, Anxiety, and ADHD Family History: History reviewed. No pertinent family history.  Social History:   Social History   Socioeconomic History  . Marital status: Single    Spouse name: Not on file  . Number of children: Not on file  . Years of education: Not on file  . Highest education level: Not on file  Occupational History  . Not on file  Tobacco Use  . Smoking status: Current Every Day Smoker    Packs/day: 0.50    Years: 3.00    Pack years: 1.50    Types: Cigarettes  . Smokeless tobacco: Never Used  Vaping Use  . Vaping Use: Every day  Substance and Sexual Activity  . Alcohol use: Not on file  . Drug use: Not on file  .  Sexual activity: Not on file  Other Topics Concern  . Not on file  Social History Narrative  . Not on file   Social Determinants of Health   Financial Resource Strain:   . Difficulty of Paying Living Expenses:   Food Insecurity:   . Worried About Charity fundraiser in the Last Year:   . Arboriculturist in the Last Year:   Transportation Needs:   . Lexicographer (Medical):   Marland Kitchen Lack of Transportation (Non-Medical):   Physical Activity:   . Days of Exercise per Week:   . Minutes of Exercise per Session:   Stress:   . Feeling of Stress :   Social Connections:   . Frequency of Communication with Friends and Family:   . Frequency of Social Gatherings with Friends and Family:   . Attends Religious Services:   . Active Member of Clubs or Organizations:   . Attends Archivist Meetings:   Marland Kitchen Marital Status:     Additional Social History: Patient reports that she is single and has no children. She currently resides in [place] at an [employer]. She is unemployed. She notes that she currently is sustaining from alcohol and denies illicit drug use. She endorses tobacco use.   Allergies:  No Known Allergies  Metabolic Disorder Labs: Lab Results  Component Value Date   HGBA1C 5.3 06/12/2019   MPG 105.41 06/12/2019   No results found for: PROLACTIN Lab Results  Component Value Date   CHOL 126 06/12/2019   TRIG 219 (H) 06/12/2019   HDL 46 06/12/2019   CHOLHDL 2.7 06/12/2019   VLDL 44 (H) 06/12/2019   LDLCALC 36 06/12/2019   Lab Results  Component Value Date   TSH 1.792 06/12/2019    Therapeutic Level Labs: No results found for: LITHIUM No results found for: CBMZ No results found for: VALPROATE  Current Medications: Current Outpatient Medications  Medication Sig Dispense Refill  . citalopram (CELEXA) 20 MG tablet Take 1 tablet (20 mg total) by mouth daily. 30 tablet 1  . gabapentin (NEURONTIN) 300 MG capsule Take 1 capsule (300 mg total) by mouth 3 (three) times daily. 90 capsule 1  . hydrOXYzine (ATARAX/VISTARIL) 25 MG tablet Take 1 tablet (25 mg total) by mouth 3 (three) times daily as needed for anxiety. 30 tablet 1   No current facility-administered medications for this visit.    Musculoskeletal: Strength & Muscle Tone: within normal limits Gait & Station: normal Patient leans: N/A  Psychiatric  Specialty Exam: Review of Systems  There were no vitals taken for this visit.There is no height or weight on file to calculate BMI.  General Appearance: Well Groomed  Eye Contact:  Good  Speech:  Clear and Coherent and Normal Rate  Volume:  Normal  Mood:  Depressed  Affect:  Congruent  Thought Process:  Coherent, Goal Directed and Linear  Orientation:  Full (Time, Place, and Person)  Thought Content:  WDL and Logical  Suicidal Thoughts:  No  Homicidal Thoughts:  No  Memory:  Immediate;   Good Recent;   Good Remote;   Good  Judgement:  Good  Insight:  Good  Psychomotor Activity:  Normal  Concentration:  Concentration: Good and Attention Span: Good  Recall:  Good  Fund of Knowledge:Good  Language: Good  Akathisia:  No  Handed:  Right  AIMS (if indicated):  Not done  Assets:  Communication Skills Desire for Improvement Housing Social Support  ADL's:  Intact  Cognition: WNL  Sleep:  Good   Screenings: AIMS     [facility]  AIMS Total Score 0      Assessment and Plan: She reports that she is having troubles concentrating.  She is agreeable to increase Celexa 10mg  to 20 mg to help improve her attention.   1. Severe recurrent major depression without psychotic features (HCC)  Continue- citalopram (CELEXA) 20 MG tablet; Take 1 tablet (20 mg total) by mouth daily.  Dispense: 30 tablet; Refill: 1 Continue- gabapentin (NEURONTIN) 300 MG capsule; Take 1 capsule (300 mg total) by mouth 3 (three) times daily.  Dispense: 90 capsule; Refill: 1 Continue- hydrOXYzine (ATARAX/VISTARIL) 25 MG tablet; Take 1 tablet (25 mg total) by mouth 3 (three) times daily as needed for anxiety.  Dispense: 30 tablet; Refill: 1   Follow-up in 2 months   , NP 6/24/202110:41 AM

## 2019-08-30 ENCOUNTER — Encounter (HOSPITAL_COMMUNITY): Payer: Self-pay | Admitting: Psychiatry

## 2019-08-30 ENCOUNTER — Other Ambulatory Visit: Payer: Self-pay

## 2019-08-30 ENCOUNTER — Ambulatory Visit (INDEPENDENT_AMBULATORY_CARE_PROVIDER_SITE_OTHER): Payer: No Payment, Other | Admitting: Psychiatry

## 2019-08-30 DIAGNOSIS — F332 Major depressive disorder, recurrent severe without psychotic features: Secondary | ICD-10-CM | POA: Diagnosis not present

## 2019-08-30 MED ORDER — GABAPENTIN 300 MG PO CAPS: 300.0000 mg | ORAL_CAPSULE | Freq: Two times a day (BID) | ORAL | 2 refills | Status: DC

## 2019-08-30 MED ORDER — GABAPENTIN 400 MG PO CAPS: 400.0000 mg | ORAL_CAPSULE | Freq: Every day | ORAL | 2 refills | Status: DC

## 2019-08-30 MED ORDER — HYDROXYZINE HCL 25 MG PO TABS: 25.0000 mg | ORAL_TABLET | Freq: Three times a day (TID) | ORAL | 2 refills | Status: DC | PRN

## 2019-08-30 MED ORDER — CITALOPRAM HYDROBROMIDE 20 MG PO TABS: 20.0000 mg | ORAL_TABLET | Freq: Every day | ORAL | 2 refills | Status: DC

## 2019-08-30 NOTE — Progress Notes (Signed)
BH MD/PA/NP OP Progress Note  08/30/2019 10:52 AM Tina Torres  MRN:  [id number]  Chief Complaint: "I've been having panic attacks at work"  HPI: 49 year old female seen today for follow up psychiatric evaluation.  She has a psychiatric history of depression and alcohol use disorder.  She is currently being managed on Celexa 20 mg daily, gabapentin 300 mg 3 times daily, Risperdal 0.5 mg in the morning,  Risperdal 1 mg at bedtime and hydroxyzine 25 mg 3 times daily as needed.  She notes that her medications are somewhat effective in managing her psychiatric conditions.  Today patient is pleasant, cooperative, and well groomed.  She denies depression however endorses feeling anxious.  She notes that recently she had a panic attack at work and passed out.  She informed provider  that she is unaware if it was a true panic attack or if her blood pressure was too low.  Provider asked patient if she had a primary care physician.  She notes that she did not however notes that she would like to be seen [facility] the [facility].  Provider also gave patient information about community health and wellness.  Patient notes that recently she has taken on a lot more work which is causing her to be stressed.  She notes that she is constantly irritable at work.  She notes that many people call out and it puts increased pressure on her which causes her to feel frustrated and overwhelmed.  She also notes that she is trying to volunteer more at the [employer] on the weekends which is increasing her anxiety.  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  Provider discussed the importance of self-care work/life balance.  She endorsed understanding and agreed.  Denies SI/HI/VAH  Patient is agreeable to increasing current gabapentin dose to 300 mg twice daily and 400 mg daily to help improve symptoms of agitation and anxiety.  She is also agreeable to  follow-up with her PCP regarding other health conditions.  She will continue all other medications as prescribed.  No other concerns noted at this time.   Visit Diagnosis:    ICD-10-CM   1. Severe recurrent major depression without psychotic features (HCC)  F33.2 citalopram (CELEXA) 20 MG tablet    hydrOXYzine (ATARAX/VISTARIL) 25 MG tablet    gabapentin (NEURONTIN) 300 MG capsule    gabapentin (NEURONTIN) 400 MG capsule    Past Psychiatric History: Depression and Alcohol use disorder  Past Medical History: No past medical history on file. No past surgical history on file.  Family Psychiatric History: Sister Bipolar, Anxiety, and ADHD  Family History: No family history on file.  Social History:  Social History   Socioeconomic History  . Marital status: Single    Spouse name: Not on file  . Number of children: Not on file  . Years of education: Not on file  . Highest education level: Not on file  Occupational History  . Not on file  Tobacco Use  . Smoking status: Current Every Day Smoker    Packs/day: 0.50    Years: 3.00    Pack years: 1.50    Types: Cigarettes  . Smokeless tobacco: Never Used  Vaping Use  . Vaping Use: Every day  Substance and Sexual Activity  . Alcohol use: Not on file  . Drug use: Not on file  . Sexual activity: Not on file  Other Topics Concern  . Not  on file  Social History Narrative  . Not on file   Social Determinants of Health   Financial Resource Strain:   . Difficulty of Paying Living Expenses: Not on file  Food Insecurity:   . Worried About Programme researcher, broadcasting/film/video in the Last Year: Not on file  . Ran Out of Food in the Last Year: Not on file  Transportation Needs:   . Lack of Transportation (Medical): Not on file  . Lack of Transportation (Non-Medical): Not on file  Physical Activity:   . Days of Exercise per Week: Not on file  . Minutes of Exercise per Session: Not on file  Stress:   . Feeling of Stress : Not on file  Social  Connections:   . Frequency of Communication with Friends and Family: Not on file  . Frequency of Social Gatherings with Friends and Family: Not on file  . Attends Religious Services: Not on file  . Active Member of Clubs or Organizations: Not on file  . Attends Banker Meetings: Not on file  . Marital Status: Not on file    Allergies: No Known Allergies  Metabolic Disorder Labs: Lab Results  Component Value Date   HGBA1C 5.3 06/12/2019   MPG 105.41 06/12/2019   No results found for: PROLACTIN Lab Results  Component Value Date   CHOL 126 06/12/2019   TRIG 219 (H) 06/12/2019   HDL 46 06/12/2019   CHOLHDL 2.7 06/12/2019   VLDL 44 (H) 06/12/2019   LDLCALC 36 06/12/2019   Lab Results  Component Value Date   TSH 1.792 06/12/2019    Therapeutic Level Labs: No results found for: LITHIUM No results found for: VALPROATE No components found for:  CBMZ  Current Medications: Current Outpatient Medications  Medication Sig Dispense Refill  . citalopram (CELEXA) 20 MG tablet Take 1 tablet (20 mg total) by mouth daily. 30 tablet 2  . gabapentin (NEURONTIN) 300 MG capsule Take 1 capsule (300 mg total) by mouth 2 (two) times daily. 120 capsule 2  . gabapentin (NEURONTIN) 400 MG capsule Take 1 capsule (400 mg total) by mouth daily. 30 capsule 2  . hydrOXYzine (ATARAX/VISTARIL) 25 MG tablet Take 1 tablet (25 mg total) by mouth 3 (three) times daily as needed for anxiety. 30 tablet 2   No current facility-administered medications for this visit.     Musculoskeletal: Strength & Muscle Tone: within normal limits Gait & Station: normal Patient leans: N/A  Psychiatric Specialty Exam: Review of Systems  There were no vitals taken for this visit.There is no height or weight on file to calculate BMI.  General Appearance: Well Groomed  Eye Contact:  Good  Speech:  Clear and Coherent and Normal Rate  Volume:  Normal  Mood:  Anxious  Affect:  Congruent  Thought Process:   Coherent, Goal Directed and Linear  Orientation:  Full (Time, Place, and Person)  Thought Content: WDL and Logical   Suicidal Thoughts:  No  Homicidal Thoughts:  No  Memory:  Immediate;   Good Recent;   Good Remote;   Good  Judgement:  Good  Insight:  Good  Psychomotor Activity:  Normal  Concentration:  Concentration: Good and Attention Span: Good  Recall:  Good  Fund of Knowledge: Good  Language: Good  Akathisia:  No  Handed:  Right  AIMS (if indicated): Not done  Assets:  Communication Skills Desire for Improvement Financial Resources/Insurance Housing Social Support  ADL's:  Intact  Cognition: WNL  Sleep:  Good  Screenings: AIMS     [facility]  AIMS Total Score 0       Assessment and Plan: Patient endorses increased anxiety and irritability surrounding current life stressors. She is agreeable to increasing current gabapentin dose to 300 mg twice daily and 400 mg daily to help improve symptoms of agitation and anxiety.  She is also agreeable to follow-up with her PCP regarding other health conditions.  She will continue all other medications as prescribed.  1. Severe recurrent major depression without psychotic features (HCC)  Continue- citalopram (CELEXA) 20 MG tablet; Take 1 tablet (20 mg total) by mouth daily.  Dispense: 30 tablet; Refill: 2 Continue- hydrOXYzine (ATARAX/VISTARIL) 25 MG tablet; Take 1 tablet (25 mg total) by mouth 3 (three) times daily as needed for anxiety.  Dispense: 30 tablet; Refill: 2 Continue- gabapentin (NEURONTIN) 300 MG capsule; Take 1 capsule (300 mg total) by mouth 2 (two) times daily.  Dispense: 120 capsule; Refill: 2 Increased- gabapentin (NEURONTIN) 400 MG capsule; Take 1 capsule (400 mg total) by mouth daily.  Dispense: 30 capsule; Refill: 2  Follow-up in 3 months   Shanna Cisco, NP 08/30/2019, 10:52 AM

## 2019-08-31 ENCOUNTER — Emergency Department (HOSPITAL_COMMUNITY): Payer: No Typology Code available for payment source

## 2019-08-31 ENCOUNTER — Emergency Department (HOSPITAL_COMMUNITY)
Admission: EM | Admit: 2019-08-31 | Discharge: 2019-09-01 | Disposition: A | Discharge status: Home / Self Care | Payer: No Typology Code available for payment source | Attending: Emergency Medicine | Admitting: Emergency Medicine

## 2019-08-31 ENCOUNTER — Encounter (HOSPITAL_COMMUNITY): Payer: Self-pay

## 2019-08-31 DIAGNOSIS — M25519 Pain in unspecified shoulder: Secondary | ICD-10-CM | POA: Insufficient documentation

## 2019-08-31 DIAGNOSIS — S29011A Strain of muscle and tendon of front wall of thorax, initial encounter: Secondary | ICD-10-CM | POA: Diagnosis not present

## 2019-08-31 DIAGNOSIS — Y929 Unspecified place or not applicable: Secondary | ICD-10-CM | POA: Diagnosis not present

## 2019-08-31 DIAGNOSIS — S0083XA Contusion of other part of head, initial encounter: Secondary | ICD-10-CM

## 2019-08-31 DIAGNOSIS — F17210 Nicotine dependence, cigarettes, uncomplicated: Secondary | ICD-10-CM | POA: Diagnosis not present

## 2019-08-31 DIAGNOSIS — Y999 Unspecified external cause status: Secondary | ICD-10-CM | POA: Diagnosis not present

## 2019-08-31 DIAGNOSIS — R000 Tachycardia, unspecified: Secondary | ICD-10-CM | POA: Insufficient documentation

## 2019-08-31 DIAGNOSIS — M549 Dorsalgia, unspecified: Secondary | ICD-10-CM | POA: Diagnosis not present

## 2019-08-31 DIAGNOSIS — S0993XA Unspecified injury of face, initial encounter: Secondary | ICD-10-CM | POA: Diagnosis present

## 2019-08-31 DIAGNOSIS — R109 Unspecified abdominal pain: Secondary | ICD-10-CM

## 2019-08-31 DIAGNOSIS — S01512A Laceration without foreign body of oral cavity, initial encounter: Secondary | ICD-10-CM | POA: Insufficient documentation

## 2019-08-31 DIAGNOSIS — Y939 Activity, unspecified: Secondary | ICD-10-CM | POA: Diagnosis not present

## 2019-08-31 LAB — CBC
HCT: 40.6 % (ref 36.0–46.0)
Hemoglobin: 13.2 g/dL (ref 12.0–15.0)
MCH: 31.7 pg (ref 26.0–34.0)
MCHC: 32.5 g/dL (ref 30.0–36.0)
MCV: 97.6 fL (ref 80.0–100.0)
Platelets: 261 10*3/uL (ref 150–400)
RBC: 4.16 MIL/uL (ref 3.87–5.11)
RDW: 14.6 % (ref 11.5–15.5)
WBC: 10.1 10*3/uL (ref 4.0–10.5)
nRBC: 0 % (ref 0.0–0.2)

## 2019-08-31 LAB — COMPREHENSIVE METABOLIC PANEL
ALT: 18 U/L (ref 0–44)
AST: 30 U/L (ref 15–41)
Albumin: 3.7 g/dL (ref 3.5–5.0)
Alkaline Phosphatase: 66 U/L (ref 38–126)
Anion gap: 13 (ref 5–15)
BUN: 22 mg/dL — ABNORMAL HIGH (ref 6–20)
CO2: 21 mmol/L — ABNORMAL LOW (ref 22–32)
Calcium: 8.8 mg/dL — ABNORMAL LOW (ref 8.9–10.3)
Chloride: 106 mmol/L (ref 98–111)
Creatinine, Ser: 0.98 mg/dL (ref 0.44–1.00)
GFR calc Af Amer: >60 mL/min (ref >60)
GFR calc non Af Amer: >60 mL/min (ref >60)
Glucose, Bld: 83 mg/dL (ref 70–99)
Potassium: 3.5 mmol/L (ref 3.5–5.1)
Sodium: 140 mmol/L (ref 135–145)
Total Bilirubin: 0.5 mg/dL (ref 0.3–1.2)
Total Protein: 6.9 g/dL (ref 6.5–8.1)

## 2019-08-31 LAB — SAMPLE TO BLOOD BANK

## 2019-08-31 LAB — ETHANOL: Alcohol, Ethyl (B): 27 mg/dL — ABNORMAL HIGH (ref <10)

## 2019-08-31 LAB — PROTIME-INR
INR: 1.1 (ref 0.8–1.2)
Prothrombin Time: 13.4 seconds (ref 11.4–15.2)

## 2019-08-31 LAB — I-STAT CHEM 8, ED
BUN: 23 mg/dL — ABNORMAL HIGH (ref 6–20)
Calcium, Ion: 1.15 mmol/L (ref 1.15–1.40)
Chloride: 107 mmol/L (ref 98–111)
Creatinine, Ser: 1 mg/dL (ref 0.44–1.00)
Glucose, Bld: 82 mg/dL (ref 70–99)
HCT: 39 % (ref 36.0–46.0)
Hemoglobin: 13.3 g/dL (ref 12.0–15.0)
Potassium: 3.6 mmol/L (ref 3.5–5.1)
Sodium: 140 mmol/L (ref 135–145)
TCO2: 20 mmol/L — ABNORMAL LOW (ref 22–32)

## 2019-08-31 MED ORDER — HYDROMORPHONE HCL 1 MG/ML IJ SOLN
0.5000 mg | Freq: Once | INTRAMUSCULAR | Status: AC
  Administered 2019-09-01: 0.5 mg via INTRAVENOUS
  Filled 2019-08-31: qty 1

## 2019-08-31 MED ORDER — SODIUM CHLORIDE 0.9 % IV BOLUS
1000.0000 mL | Freq: Once | INTRAVENOUS | Status: AC
  Administered 2019-08-31: 1000 mL via INTRAVENOUS

## 2019-08-31 MED ORDER — IOHEXOL 300 MG/ML  SOLN
100.0000 mL | Freq: Once | INTRAMUSCULAR | Status: AC | PRN
  Administered 2019-08-31: 100 mL via INTRAVENOUS

## 2019-08-31 MED ORDER — FENTANYL CITRATE (PF) 100 MCG/2ML IJ SOLN
50.0000 ug | INTRAMUSCULAR | Status: AC | PRN
  Administered 2019-08-31 – 2019-09-01 (×2): 50 ug via INTRAVENOUS
  Filled 2019-08-31 (×2): qty 2
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
## 2019-09-01 ENCOUNTER — Other Ambulatory Visit: Payer: Self-pay

## 2019-09-01 ENCOUNTER — Emergency Department (HOSPITAL_COMMUNITY): Payer: No Typology Code available for payment source

## 2019-09-01 MED ORDER — GADOBUTROL 1 MMOL/ML IV SOLN
7.0000 mL | Freq: Once | INTRAVENOUS | Status: AC | PRN
  Administered 2019-09-01: 7 mL via INTRAVENOUS

## 2019-09-01 MED ORDER — HYDROCODONE-ACETAMINOPHEN 5-325 MG PO TABS
1.0000 | ORAL_TABLET | Freq: Four times a day (QID) | ORAL | 0 refills | Status: DC | PRN
Start: 2019-09-01 — End: 2019-09-20

## 2019-09-01 MED ORDER — HYDROMORPHONE HCL 1 MG/ML IJ SOLN
0.5000 mg | Freq: Once | INTRAMUSCULAR | Status: AC
  Administered 2019-09-01: 0.5 mg via INTRAVENOUS
  Filled 2019-09-01: qty 1

## 2019-09-01 NOTE — Discharge Instructions (Signed)
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
## 2019-09-01 NOTE — ED Notes (Signed)
Pt sleeping. Will return if pt needs pain medicine

## 2019-09-15 ENCOUNTER — Emergency Department (HOSPITAL_COMMUNITY)
Admission: EM | Admit: 2019-09-15 | Discharge: 2019-09-16 | Disposition: A | Discharge status: Other Acute Inpatient Hospital | Payer: Self-pay | Attending: Emergency Medicine | Admitting: Emergency Medicine

## 2019-09-15 ENCOUNTER — Encounter (HOSPITAL_COMMUNITY): Payer: Self-pay

## 2019-09-15 ENCOUNTER — Other Ambulatory Visit: Payer: Self-pay

## 2019-09-15 DIAGNOSIS — Z20822 Contact with and (suspected) exposure to covid-19: Secondary | ICD-10-CM | POA: Insufficient documentation

## 2019-09-15 DIAGNOSIS — T38892A Poisoning by other hormones and synthetic substitutes, intentional self-harm, initial encounter: Secondary | ICD-10-CM | POA: Insufficient documentation

## 2019-09-15 DIAGNOSIS — F332 Major depressive disorder, recurrent severe without psychotic features: Secondary | ICD-10-CM | POA: Insufficient documentation

## 2019-09-15 DIAGNOSIS — T426X2A Poisoning by other antiepileptic and sedative-hypnotic drugs, intentional self-harm, initial encounter: Secondary | ICD-10-CM | POA: Insufficient documentation

## 2019-09-15 DIAGNOSIS — F1010 Alcohol abuse, uncomplicated: Secondary | ICD-10-CM | POA: Insufficient documentation

## 2019-09-15 DIAGNOSIS — Y906 Blood alcohol level of 120-199 mg/100 ml: Secondary | ICD-10-CM | POA: Insufficient documentation

## 2019-09-15 DIAGNOSIS — F17210 Nicotine dependence, cigarettes, uncomplicated: Secondary | ICD-10-CM | POA: Insufficient documentation

## 2019-09-15 DIAGNOSIS — R45851 Suicidal ideations: Secondary | ICD-10-CM

## 2019-09-15 HISTORY — DX: Depression, unspecified: F32.A

## 2019-09-15 LAB — CBC
HCT: 41.4 % (ref 36.0–46.0)
Hemoglobin: 13.6 g/dL (ref 12.0–15.0)
MCH: 31.9 pg (ref 26.0–34.0)
MCHC: 32.9 g/dL (ref 30.0–36.0)
MCV: 97.2 fL (ref 80.0–100.0)
Platelets: 363 10*3/uL (ref 150–400)
RBC: 4.26 MIL/uL (ref 3.87–5.11)
RDW: 14.6 % (ref 11.5–15.5)
WBC: 14.2 10*3/uL — ABNORMAL HIGH (ref 4.0–10.5)
nRBC: 0 % (ref 0.0–0.2)

## 2019-09-15 LAB — COMPREHENSIVE METABOLIC PANEL
ALT: 13 U/L (ref 0–44)
AST: 20 U/L (ref 15–41)
Albumin: 3.8 g/dL (ref 3.5–5.0)
Alkaline Phosphatase: 109 U/L (ref 38–126)
Anion gap: 14 (ref 5–15)
BUN: 15 mg/dL (ref 6–20)
CO2: 24 mmol/L (ref 22–32)
Calcium: 8.9 mg/dL (ref 8.9–10.3)
Chloride: 105 mmol/L (ref 98–111)
Creatinine, Ser: 0.89 mg/dL (ref 0.44–1.00)
GFR calc Af Amer: >60 mL/min (ref >60)
GFR calc non Af Amer: >60 mL/min (ref >60)
Glucose, Bld: 83 mg/dL (ref 70–99)
Potassium: 4.1 mmol/L (ref 3.5–5.1)
Sodium: 143 mmol/L (ref 135–145)
Total Bilirubin: 0.5 mg/dL (ref 0.3–1.2)
Total Protein: 7 g/dL (ref 6.5–8.1)

## 2019-09-15 LAB — I-STAT BETA HCG BLOOD, ED (MC, WL, AP ONLY): I-stat hCG, quantitative: 6.3 m[IU]/mL — ABNORMAL HIGH (ref <5)

## 2019-09-15 LAB — ETHANOL: Alcohol, Ethyl (B): 192 mg/dL — ABNORMAL HIGH (ref <10)

## 2019-09-15 LAB — SALICYLATE LEVEL: Salicylate Lvl: <7 mg/dL — ABNORMAL LOW (ref 7.0–30.0)

## 2019-09-15 LAB — RAPID URINE DRUG SCREEN, HOSP PERFORMED
Amphetamines: NOT DETECTED
Barbiturates: NOT DETECTED
Benzodiazepines: NOT DETECTED
Cocaine: NOT DETECTED
Opiates: NOT DETECTED
Tetrahydrocannabinol: NOT DETECTED

## 2019-09-15 LAB — ACETAMINOPHEN LEVEL: Acetaminophen (Tylenol), Serum: <10 ug/mL — ABNORMAL LOW (ref 10–30)

## 2019-09-15 NOTE — ED Notes (Signed)
Poison control stated that if patient states she OD on her Celexa we will need to contact poison control back for additional instructions

## 2019-09-15 NOTE — ED Triage Notes (Signed)
Patient brought in via POV for attempted suicidal overdose taking gabapentin and "synthetic opium". Patient states she was feeling hopeless. Patient reports a 5th of 62. Patient states today is the first time she drank in 6 months. Patient states she had a falling out with best friend.
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
## 2019-09-15 NOTE — ED Notes (Signed)
Pt placed on monitor and 1 pt belonging bag and 2 personal bag placed in cabinet behind 9-25 nursing station.

## 2019-09-16 ENCOUNTER — Other Ambulatory Visit: Payer: Self-pay

## 2019-09-16 ENCOUNTER — Inpatient Hospital Stay (HOSPITAL_COMMUNITY)
Admission: AD | Admit: 2019-09-16 | Discharge: 2019-09-20 | DRG: 885 | Disposition: A | Discharge status: Home / Self Care | Payer: Federal, State, Local not specified - Other | Source: Intra-hospital | Attending: Psychiatry | Admitting: Psychiatry

## 2019-09-16 ENCOUNTER — Encounter (HOSPITAL_COMMUNITY): Payer: Self-pay | Admitting: Psychiatry

## 2019-09-16 DIAGNOSIS — F17210 Nicotine dependence, cigarettes, uncomplicated: Secondary | ICD-10-CM | POA: Diagnosis present

## 2019-09-16 DIAGNOSIS — F1920 Other psychoactive substance dependence, uncomplicated: Secondary | ICD-10-CM | POA: Diagnosis present

## 2019-09-16 DIAGNOSIS — F1020 Alcohol dependence, uncomplicated: Secondary | ICD-10-CM | POA: Diagnosis present

## 2019-09-16 DIAGNOSIS — T426X2A Poisoning by other antiepileptic and sedative-hypnotic drugs, intentional self-harm, initial encounter: Secondary | ICD-10-CM | POA: Diagnosis present

## 2019-09-16 DIAGNOSIS — G4700 Insomnia, unspecified: Secondary | ICD-10-CM | POA: Diagnosis present

## 2019-09-16 DIAGNOSIS — R45851 Suicidal ideations: Secondary | ICD-10-CM | POA: Diagnosis present

## 2019-09-16 DIAGNOSIS — Z590 Homelessness: Secondary | ICD-10-CM

## 2019-09-16 DIAGNOSIS — T50902D Poisoning by unspecified drugs, medicaments and biological substances, intentional self-harm, subsequent encounter: Secondary | ICD-10-CM | POA: Diagnosis not present

## 2019-09-16 DIAGNOSIS — Z20822 Contact with and (suspected) exposure to covid-19: Secondary | ICD-10-CM | POA: Diagnosis present

## 2019-09-16 DIAGNOSIS — Y906 Blood alcohol level of 120-199 mg/100 ml: Secondary | ICD-10-CM | POA: Diagnosis present

## 2019-09-16 DIAGNOSIS — T50902A Poisoning by unspecified drugs, medicaments and biological substances, intentional self-harm, initial encounter: Secondary | ICD-10-CM | POA: Diagnosis present

## 2019-09-16 DIAGNOSIS — F332 Major depressive disorder, recurrent severe without psychotic features: Principal | ICD-10-CM | POA: Diagnosis present

## 2019-09-16 HISTORY — DX: Diverticulitis of intestine, part unspecified, without perforation or abscess without bleeding: K57.92

## 2019-09-16 HISTORY — DX: Unspecified convulsions: R56.9

## 2019-09-16 LAB — SARS CORONAVIRUS 2 BY RT PCR (HOSPITAL ORDER, PERFORMED IN ~~LOC~~ HOSPITAL LAB): SARS Coronavirus 2: NEGATIVE

## 2019-09-16 MED ORDER — CITALOPRAM HYDROBROMIDE 20 MG PO TABS
20.0000 mg | ORAL_TABLET | Freq: Every day | ORAL | Status: DC
  Administered 2019-09-17 – 2019-09-19 (×3): 20 mg via ORAL
  Filled 2019-09-16: qty 7
  Filled 2019-09-16 (×2): qty 1
  Filled 2019-09-16 (×2): qty 7
  Filled 2019-09-16: qty 1

## 2019-09-16 MED ORDER — CHLORDIAZEPOXIDE HCL 25 MG PO CAPS
25.0000 mg | ORAL_CAPSULE | ORAL | Status: DC
  Administered 2019-09-17: 25 mg via ORAL

## 2019-09-16 MED ORDER — LOPERAMIDE HCL 2 MG PO CAPS
2.0000 mg | ORAL_CAPSULE | ORAL | Status: AC | PRN
  Administered 2019-09-16: 2 mg via ORAL
  Filled 2019-09-16: qty 1

## 2019-09-16 MED ORDER — CHLORDIAZEPOXIDE HCL 25 MG PO CAPS
25.0000 mg | ORAL_CAPSULE | Freq: Four times a day (QID) | ORAL | Status: AC
  Administered 2019-09-16 – 2019-09-18 (×5): 25 mg via ORAL
  Filled 2019-09-16 (×5): qty 1

## 2019-09-16 MED ORDER — ONDANSETRON 4 MG PO TBDP
4.0000 mg | ORAL_TABLET | Freq: Four times a day (QID) | ORAL | Status: AC | PRN
  Administered 2019-09-16 – 2019-09-17 (×2): 4 mg via ORAL
  Filled 2019-09-16 (×2): qty 1

## 2019-09-16 MED ORDER — OXYCODONE-ACETAMINOPHEN 5-325 MG PO TABS
1.0000 | ORAL_TABLET | Freq: Once | ORAL | Status: AC
  Administered 2019-09-16: 1 via ORAL
  Filled 2019-09-16: qty 1

## 2019-09-16 MED ORDER — THIAMINE HCL 100 MG PO TABS
100.0000 mg | ORAL_TABLET | Freq: Every day | ORAL | Status: DC
  Administered 2019-09-17 – 2019-09-19 (×3): 100 mg via ORAL
  Filled 2019-09-16 (×5): qty 1

## 2019-09-16 MED ORDER — SODIUM CHLORIDE 0.9 % IV BOLUS
1000.0000 mL | Freq: Once | INTRAVENOUS | Status: AC
  Administered 2019-09-16: 1000 mL via INTRAVENOUS

## 2019-09-16 MED ORDER — MAGNESIUM HYDROXIDE 400 MG/5ML PO SUSP: 30.0000 mL | Freq: Every day | ORAL | Status: DC | PRN

## 2019-09-16 MED ORDER — ACETAMINOPHEN 325 MG PO TABS: 650.0000 mg | ORAL_TABLET | Freq: Four times a day (QID) | ORAL | Status: DC | PRN

## 2019-09-16 MED ORDER — ACETAMINOPHEN 325 MG PO TABS
650.0000 mg | ORAL_TABLET | Freq: Once | ORAL | Status: AC
  Administered 2019-09-16: 650 mg via ORAL
  Filled 2019-09-16: qty 2

## 2019-09-16 MED ORDER — IBUPROFEN 200 MG PO TABS: 600.0000 mg | ORAL_TABLET | Freq: Three times a day (TID) | ORAL | Status: DC | PRN

## 2019-09-16 MED ORDER — CHLORDIAZEPOXIDE HCL 25 MG PO CAPS: 25.0000 mg | ORAL_CAPSULE | Freq: Every day | ORAL | Status: DC

## 2019-09-16 MED ORDER — ADULT MULTIVITAMIN W/MINERALS CH
1.0000 | ORAL_TABLET | Freq: Every day | ORAL | Status: DC
  Administered 2019-09-17 – 2019-09-19 (×3): 1 via ORAL
  Filled 2019-09-16 (×5): qty 1

## 2019-09-16 MED ORDER — TRAZODONE HCL 50 MG PO TABS
50.0000 mg | ORAL_TABLET | Freq: Every evening | ORAL | Status: DC | PRN
  Administered 2019-09-16 – 2019-09-19 (×4): 50 mg via ORAL
  Filled 2019-09-16 (×4): qty 1
  Filled 2019-09-16: qty 7

## 2019-09-16 MED ORDER — ACETAMINOPHEN 325 MG PO TABS
650.0000 mg | ORAL_TABLET | Freq: Four times a day (QID) | ORAL | Status: DC | PRN
  Administered 2019-09-16 – 2019-09-17 (×2): 650 mg via ORAL
  Filled 2019-09-16 (×2): qty 2

## 2019-09-16 MED ORDER — THIAMINE HCL 100 MG/ML IJ SOLN
100.0000 mg | Freq: Once | INTRAMUSCULAR | Status: AC
  Administered 2019-09-16: 100 mg via INTRAMUSCULAR
  Filled 2019-09-16: qty 2

## 2019-09-16 MED ORDER — CHLORDIAZEPOXIDE HCL 25 MG PO CAPS: 25.0000 mg | ORAL_CAPSULE | Freq: Four times a day (QID) | ORAL | Status: DC | PRN

## 2019-09-16 MED ORDER — IBUPROFEN 200 MG PO TABS
400.0000 mg | ORAL_TABLET | Freq: Once | ORAL | Status: AC
  Administered 2019-09-16: 400 mg via ORAL
  Filled 2019-09-16: qty 2

## 2019-09-16 MED ORDER — CHLORDIAZEPOXIDE HCL 25 MG PO CAPS
25.0000 mg | ORAL_CAPSULE | Freq: Three times a day (TID) | ORAL | Status: DC
  Administered 2019-09-18: 25 mg via ORAL
  Filled 2019-09-16 (×2): qty 1

## 2019-09-16 MED ORDER — PNEUMOCOCCAL VAC POLYVALENT 25 MCG/0.5ML IJ INJ
0.5000 mL | INJECTION | INTRAMUSCULAR | Status: DC
  Filled 2019-09-16: qty 0.5

## 2019-09-16 MED ORDER — ALUM & MAG HYDROXIDE-SIMETH 200-200-20 MG/5ML PO SUSP: 30.0000 mL | ORAL | Status: DC | PRN

## 2019-09-16 MED ORDER — HYDROXYZINE HCL 25 MG PO TABS
25.0000 mg | ORAL_TABLET | Freq: Three times a day (TID) | ORAL | Status: DC | PRN
  Administered 2019-09-17 – 2019-09-19 (×5): 25 mg via ORAL
  Filled 2019-09-16 (×5): qty 1
  Filled 2019-09-16: qty 10
  Filled 2019-09-16: qty 1

## 2019-09-16 NOTE — Progress Notes (Signed)
   09/16/19 1945  Psych Admission Type (Psych Patients Only)  Admission Status Voluntary  Psychosocial Assessment  Patient Complaints Anhedonia;Anxiety;Depression;Hopelessness;Irritability;Sadness;Substance abuse;Worrying  Eye Contact Avoids  Facial Expression Flat  Affect Flat  Speech Logical/coherent  Interaction Assertive  Motor Activity Slow  Appearance/Hygiene Disheveled  Behavior Characteristics Cooperative;Appropriate to situation;Anxious;Fidgety  Mood Depressed;Anxious;Irritable;Pleasant  Thought Process  Coherency WDL  Content WDL  Delusions None reported or observed  Perception WDL  Hallucination None reported or observed  Judgment Impaired  Confusion None  Danger to Self  Current suicidal ideation? Denies  Danger to Others  Danger to Others None reported or observed

## 2019-09-16 NOTE — ED Provider Notes (Signed)
6:15 - patient's behavior is escalating, yelling in the hallway, combative/agitated. She is threatening to leave the department.   Chart reviewed. The patient presented for intentional overdose stating she was "feeling hopeless". IVC felt appropriate as patient has demonstrated being a danger to herself.    Elpidio Anis, PA-C 09/16/19 0622    Gilda Crease, MD 09/16/19 [phone]

## 2019-09-16 NOTE — ED Notes (Signed)
Pt ambulated to restroom and complaining of pain in chest from accident.

## 2019-09-16 NOTE — Progress Notes (Signed)
   09/16/19 1945  COVID-19 Daily Checkoff  Have you had a fever (temp > 37.80C/100F)  in the past 24 hours?  No  COVID-19 EXPOSURE  Have you traveled outside the state in the past 14 days? No  Have you been in contact with someone with a confirmed diagnosis of COVID-19 or PUI in the past 14 days without wearing appropriate PPE? No  Have you been living in the same home as a person with confirmed diagnosis of COVID-19 or a PUI (household contact)? No  Have you been diagnosed with COVID-19? No

## 2019-09-16 NOTE — BH Assessment (Signed)
TTS attempted to complete assessment. Per Elsie Ra, Charge Nurse pt's blood pressure is low and pt is sleepy. This therapist informed Charge Nurse to call TTS when pt is ready to be assessed.  Dolores Frame, MSW, LCSW-A Triage Specialist [phone]

## 2019-09-16 NOTE — ED Notes (Signed)
Patient transfered to [facility] via Safe transport. Patient was able to ambulate unassisted. Paperwork and belongings went with the patient.
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
## 2019-09-16 NOTE — ED Notes (Signed)
Patient moved to [facility] A assignment. Patient became agitated and stated she wanted to leave. RN advised MD/PA that patient is saying she wants to leave. Security called to bedside

## 2019-09-16 NOTE — ED Notes (Signed)
Per [facility], they did not give initial caller an ETA when she contacted them at [phone]-

## 2019-09-16 NOTE — ED Notes (Signed)
Pt ambulated to restroom.   Pt maintained a steady gait and did not complain of any distress.

## 2019-09-16 NOTE — BH Assessment (Addendum)
[facility] Assessment Progress Note  Per Berneice Heinrich, NP, this pt requires psychiatric hospitalization at this time.  Percell Boston, RN has assigned pt to [facility] 300-2; [facility] will be ready to receive pt at 15:00.  Pt has reluctantly signed Voluntary Admission and Consent for Treatment, as well as Consent to Release Information to [name], NP at [facility] and to a friend, Brooke Bonito, and signed forms have been faxed to [facility].  EDP Raeford Razor, MD, and pt's nurse, Lamar Laundry, have been notified, and Lamar Laundry agrees to send original paperwork along with pt via [facility], and to call report to [phone].  Doylene Canning, [place] Behavioral Health Coordinator [phone]   Addendum:  Pt requests that this writer call Victorino Dike and to ask that she call the pt at [facility].  I placed the call at 13:44.  It rolled to voice mail and I left a message.  I have also called Brittney and left a voice message for her.  Doylene Canning, [place] Behavior[facility] Coordinator [phone]

## 2019-09-16 NOTE — ED Notes (Signed)
Transportation has been called.  

## 2019-09-16 NOTE — ED Notes (Signed)
Patients' belongings included shirt, bra, shoes, shorts and cell phone. Belongings placed in nursing station rooms 1-8

## 2019-09-17 DIAGNOSIS — F1020 Alcohol dependence, uncomplicated: Secondary | ICD-10-CM

## 2019-09-17 DIAGNOSIS — T50902A Poisoning by unspecified drugs, medicaments and biological substances, intentional self-harm, initial encounter: Secondary | ICD-10-CM | POA: Diagnosis present

## 2019-09-17 DIAGNOSIS — F332 Major depressive disorder, recurrent severe without psychotic features: Principal | ICD-10-CM

## 2019-09-17 DIAGNOSIS — F1920 Other psychoactive substance dependence, uncomplicated: Secondary | ICD-10-CM

## 2019-09-17 MED ORDER — HALOPERIDOL LACTATE 5 MG/ML IJ SOLN: 5.0000 mg | Freq: Four times a day (QID) | INTRAMUSCULAR | Status: DC | PRN

## 2019-09-17 MED ORDER — HALOPERIDOL 5 MG PO TABS
ORAL_TABLET | ORAL | Status: AC
  Filled 2019-09-17: qty 1

## 2019-09-17 MED ORDER — NICOTINE 21 MG/24HR TD PT24
21.0000 mg | MEDICATED_PATCH | Freq: Every day | TRANSDERMAL | Status: DC
  Administered 2019-09-18 – 2019-09-19 (×2): 21 mg via TRANSDERMAL
  Filled 2019-09-17 (×5): qty 1

## 2019-09-17 MED ORDER — HALOPERIDOL 5 MG PO TABS
5.0000 mg | ORAL_TABLET | Freq: Four times a day (QID) | ORAL | Status: DC | PRN
  Administered 2019-09-17 – 2019-09-19 (×3): 5 mg via ORAL
  Filled 2019-09-17 (×2): qty 1

## 2019-09-17 MED ORDER — NICOTINE 21 MG/24HR TD PT24
MEDICATED_PATCH | TRANSDERMAL | Status: AC
  Filled 2019-09-17: qty 1

## 2019-09-17 NOTE — Progress Notes (Signed)
Pt demanding to be discharged home so she can go to work.  Pt stated that she works for tips at [employer] and if she doesn't go to work tomorrow, pt will loose her house.  Pt said "I'm fucked if I don't go in to work tomorrow."  RN explained 72 hour request for discharge form and pt said she needed to leave now.  Pt understands IVC process and that MD feels pt is a danger to herself.  Pt agreed to sign 72 hour request for discharge form.  Pt given a snack and is sitting in the dayroom.  Pt given haldol per MD order.
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
## 2019-09-17 NOTE — [facility] Group Notes (Signed)
.  Psychoeducational Group Note    Date: 09-17-19 Time: 0900    Goal Setting and Orientation Group  Purpose of Group: To be able to set a goal that is measurable and that can be accomplished in one day. Pt will also be oriented to the unit rules and to the schedule of the day.  Participation Level:  Did Not Attend   Tina Torres A  

## 2019-09-17 NOTE — [facility] Group Notes (Signed)
Pt did not attend wrap up group this evening. Pt was in bed sleeping.  

## 2019-09-17 NOTE — Progress Notes (Signed)
Pt attend wrap up group AA. 

## 2019-09-17 NOTE — [facility] Group Notes (Signed)
LCSW Group Therapy Note  09/17/2019   10:00-11:00am   Type of Therapy and Topic:  Group Therapy: Anger Cues and Responses  Participation Level:  None   Description of Group:   In this group, patients learned how to recognize the physical, cognitive, emotional, and behavioral responses they have to anger-provoking situations.  They identified a recent time they became angry and how they reacted.  They analyzed how their reaction was possibly beneficial and how it was possibly unhelpful.  The group discussed a variety of healthier coping skills that could help with such a situation in the future.  Focus was placed on how helpful it is to recognize the underlying emotions to our anger, because working on those can lead to a more permanent solution as well as our ability to focus on the important rather than the urgent.  Therapeutic Goals: 1. Patients will remember their last incident of anger and how they felt emotionally and physically, what their thoughts were at the time, and how they behaved. 2. Patients will identify how their behavior at that time worked for them, as well as how it worked against them. 3. Patients will explore possible new behaviors to use in future anger situations. 4. Patients will learn that anger itself is normal and cannot be eliminated, and that healthier reactions can assist with resolving conflict rather than worsening situations.  Summary of Patient Progress:  The patient was not present at the beginning of group so did not share a recent time of anger or hear anyone else's shares.  She came in to the group briefly for about 7-8 minutes and made one comment about a situation being discussed, cursing, then left a few minutes later.  Therapeutic Modalities:   Cognitive Behavioral Therapy  Lynnell Chad

## 2019-09-17 NOTE — Progress Notes (Addendum)
Pt denies SI/HI/AVH.  Pt endorses depression and anxiety.  Pt complaining of nausea, anxiety and pt is sweating.  Librium protocol in place and pt has been resting in bed.  Pt's vitals are stable.  Pt took medications without incident.  RN will continue to monitor and provide assistance as needed.  Q 15 min safety checks remain in place.

## 2019-09-17 NOTE — [facility] Suicide Risk Assessment (Signed)
[facility] Admission Suicide Risk Assessment   Nursing information obtained from:  Patient Demographic factors:  Caucasian, Low socioeconomic status, Living alone Current Mental Status:  Suicidal ideation indicated by patient Loss Factors:  Financial problems / change in socioeconomic status, Decline in physical health Historical Factors:  Prior suicide attempts, Impulsivity Risk Reduction Factors:  Positive social support  Total Time spent with patient: 1 hour Principal Problem: MDD (major depressive disorder), recurrent episode, severe (HCC) Diagnosis:  Principal Problem:   MDD (major depressive disorder), recurrent episode, severe (HCC) Active Problems:   Polysubstance dependence including opioid type drug without complication, continuous use (HCC)   Alcohol use disorder, severe, dependence (HCC)   Overdose, intentional self-harm, initial encounter (HCC)  Subjective Data: "I need to get out of here."  History of Present Illness: This is the second admission in this [facility] in a little over 3 months for this 49 year old Caucasian female. She does have hx of polysubstance use disorders & other mental illness. She was treated & discharged from [facility] on 06-13-19 with a referral & an appointment to follow-up at the [facility]. Patient reports today that she did not make that appointment. Eesha is being admitted to the [facility] this time around with complaints of suicidal ideations with plan to overdose on medications. Reports indicated that she stated this was triggered by her recent relapse on alcohol after 6 months sobriety. During this admission assessment, Asante reports, "A friend took me to the [facility] on Wednesday to be checked out. I had gotten quite drunk & took some pills called Kratom. Kratom is a synthetic heroin that helps me get high. I have been using it for a couple of weeks. When I get high on this drug, it makes me drink a lot more alcohol. After I got high & drunk this time, I became  suicidal, but I did not attempt to hurt myself. I'm no longer suicidal today. I need to be discharged today so that I can go to the [facility]. I have someone that will help me get into [facility] right away to continue drug treatment. I do not need to be here. I want to leave this hospital today". Patient currently denies any substance withdrawal symptoms.  Associated Signs/Symptoms:  Depression Symptoms:  Patient currently denies any symptoms of depression or anxiety. She is asking to be discharged as she has someone that will help her get into [facility].  (Hypo) Manic Symptoms:  Impulsivity, Lability of Mood,  Anxiety Symptoms:  Excessive Worry,  Psychotic Symptoms:  Denies any hallucinations, delusions or paranoia.  PTSD Symptoms: Denies any PTSD symptoms or events. NA   Past Psychiatric History: She has been seen multiple times since 2018 in various emergency rooms in the area.  Her last 2 psychiatric hospitalizations were here at [facility], [facility].  There was a hospitalization on 09/24/2016.  She was placed on Celexa, Topamax and Neurontin at that time.  She had been previously treated with Vraylar by her report.  Is the patient at risk to self? No.  Has the patient been a risk to self in the past 6 months? Yes.    Has the patient been a risk to self within the distant past? Yes.    Is the patient a risk to others? No.  Has the patient been a risk to others in the past 6 months? No.  Has the patient been a risk to others within the distant past? No.   Prior Inpatient Therapy:  Yes, [facility] x multiple times.   Prior Outpatient Therapy: Yes, but patient says did not make those appointments.    Continued Clinical Symptoms:  Alcohol Use Disorder Identification Test Final Score (AUDIT): 22 The "Alcohol Use Disorders Identification Test", Guidelines for Use in Primary Care, Second Edition.  World Science writer [facility]). Score between 0-7:  no or low risk or alcohol related  problems. Score between 8-15:  moderate risk of alcohol related problems. Score between 16-19:  high risk of alcohol related problems. Score 20 or above:  warrants further diagnostic evaluation for alcohol dependence and treatment.   CLINICAL FACTORS:   Severe Anxiety and/or Agitation Depression:   Comorbid alcohol abuse/dependence Impulsivity Severe Alcohol/Substance Abuse/Dependencies More than one psychiatric diagnosis Previous Psychiatric Diagnoses and Treatments  Musculoskeletal: Strength & Muscle Tone: within normal limits Gait & Station: normal Patient leans: N/A  Psychiatric Specialty Exam: Physical Exam Vitals and nursing note reviewed.  Constitutional:      Appearance: She is well-developed.  HENT:     Head: Normocephalic and atraumatic.     Nose: Nose normal.     Mouth/Throat:     Pharynx: Oropharynx is clear.  Eyes:     Pupils: Pupils are equal, round, and reactive to light.  Neck:     Comments: Deferred Cardiovascular:     Rate and Rhythm: Normal rate.     Pulses: Normal pulses.  Pulmonary:     Effort: Pulmonary effort is normal.  Musculoskeletal:        General: Normal range of motion.     Cervical back: Normal range of motion.  Skin:    General: Skin is warm and dry.  Neurological:     Mental Status: She is alert and oriented to person, place, and time.     Review of Systems  Constitutional: Negative for chills, diaphoresis and fever.  HENT: Negative for congestion, rhinorrhea, sneezing and sore throat.   Eyes: Negative for discharge.  Respiratory: Negative for cough, chest tightness, shortness of breath and stridor.   Cardiovascular: Negative for chest pain and palpitations.  Gastrointestinal: Negative for diarrhea, nausea and vomiting.  Endocrine: Negative for cold intolerance.  Genitourinary: Negative for difficulty urinating.  Musculoskeletal: Negative for arthralgias and myalgias.  Skin: Negative.   Allergic/Immunologic: Negative for  environmental allergies and food allergies.       Allergies: NKDA  Neurological: Negative for dizziness, tremors, seizures, syncope, facial asymmetry, speech difficulty, weakness, light-headedness, numbness and headaches.  Psychiatric/Behavioral: Positive for dysphoric mood, sleep disturbance and suicidal ideas. Negative for agitation, behavioral problems, confusion, decreased concentration, hallucinations and self-injury. The patient is nervous/anxious. The patient is not hyperactive.     Blood pressure 102/66, pulse (!) 58, temperature 98 F (36.7 C), temperature source Oral, resp. rate 18, height 5\' 3"  (1.6 m), weight 75.3 kg, last menstrual period 08/22/2019, SpO2 95 %.Body mass index is 29.41 kg/m.  General Appearance: Casual  Eye Contact:  Fair  Speech:  Clear and Coherent and Normal Rate  Volume:  Normal  Mood:  "Im not depressed or anxious any more"  Affect:  Appropriate  Thought Process:  Coherent and Descriptions of Associations: Intact  Orientation:  Full (Time, Place, and Person)  Thought Content:  Logical  Suicidal Thoughts:  Currently denies any thoughts, plans or intent  Homicidal Thoughts:  Denies  Memory:  Immediate;   Good Recent;   Good Remote;   Good  Judgement:  Fair  Insight:  Lacking  Psychomotor Activity:  Normal  Concentration:  Concentration:  Fair and Attention Span: Fair  Recall:  [employer] of Knowledge:  Fair  Language:  Good  Akathisia:  Negative  Handed:  Right  AIMS (if indicated):     Assets:  Desire for Improvement Resilience  ADL's:  Intact  Cognition:  WNL  Sleep:  Number of Hours: 6.75     COGNITIVE FEATURES THAT CONTRIBUTE TO RISK:  Closed-mindedness and Thought constriction (tunnel vision)    SUICIDE RISK:   Severe:  Frequent, intense, and enduring suicidal ideation, specific plan, no subjective intent, but some objective markers of intent (i.e., choice of lethal method), the method is accessible, some limited preparatory behavior,  evidence of impaired self-control, severe dysphoria/symptomatology, multiple risk factors present, and few if any protective factors, particularly a lack of social support.  PLAN OF CARE:  Treatment Plan Summary: Daily contact with patient to assess and evaluate symptoms and progress in treatment and Medication management.  Treatment Plan/Recommendations: 1. Admit for crisis management and stabilization, estimated length of stay 3-5 days.  2. Medication management to reduce current symptoms to base line and improve the patient's overall level of functioning: See [facility] for plan of care. 3. Treat health problems as indicated.  4. Develop treatment plan to decrease risk of relapse upon discharge and the need for readmission.  5. Psycho-social education regarding relapse prevention and self care.  6. Health care follow up as needed for medical problems.  7. Review, reconcile, and reinstate any pertinent home medications for other health issues where appropriate. 8. Call for consults with hospitalist for any additional specialty patient care services as needed.  Observation Level/Precautions:  Detox 15 minute checks Seizure  Laboratory:  Chemistry Profile  Psychotherapy: Group sessions   Medications: See MAR   Consultations: As needed   Discharge Concerns: Safety, mood stability, maintaining sobriety.   Estimated LOS: 2-4 days  Other: Admit to the 300-hall   Physician Treatment Plan for Primary Diagnosis: MDD (major depressive disorder), recurrent episode, severe (HCC)  Alcohol use disorder, severe, dependence (HCC)  Long Term Goal(s): Improvement in symptoms so as ready for discharge  Short Term Goals: Ability to identify changes in lifestyle to reduce recurrence of condition will improve, Ability to verbalize feelings will improve, Ability to disclose and discuss suicidal ideas and Ability to demonstrate self-control will improve  Physician Treatment Plan for Secondary Diagnosis:  Principal Problem:   Alcohol use disorder, severe, dependence (HCC) Active Problems:   MDD (major depressive disorder), recurrent episode, severe (HCC)   Polysubstance dependence including opioid type drug without complication, continuous use [facility]) Patient Active Problem List   Diagnosis Date Noted  . Polysubstance dependence including opioid type drug without complication, continuous use (HCC) 09/17/2019  . Alcohol use disorder, severe, dependence (HCC) 09/17/2019  . Overdose, intentional self-harm, initial encounter (HCC) 09/17/2019  . MDD (major depressive disorder), recurrent episode, severe (HCC) 09/16/2019  . Severe recurrent major depression without psychotic features (HCC) 06/11/2019  . Opioid use disorder (HCC) 11/10/2016  . Anxiety disorder, unspecified 09/24/2016     Long Term Goal(s): Improvement in symptoms so as ready for discharge  Short Term Goals: Ability to identify and develop effective coping behaviors will improve, Compliance with prescribed medications will improve and Ability to identify triggers associated with substance abuse/mental health issues will improve   I certify that inpatient services furnished can reasonably be expected to improve the patient's condition.   Mariel Craft, MD 09/17/2019, 7:01 PM
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
## 2019-09-18 DIAGNOSIS — F1920 Other psychoactive substance dependence, uncomplicated: Secondary | ICD-10-CM | POA: Diagnosis not present

## 2019-09-18 DIAGNOSIS — T50902D Poisoning by unspecified drugs, medicaments and biological substances, intentional self-harm, subsequent encounter: Secondary | ICD-10-CM

## 2019-09-18 DIAGNOSIS — T1491XA Suicide attempt, initial encounter: Secondary | ICD-10-CM

## 2019-09-18 DIAGNOSIS — F1020 Alcohol dependence, uncomplicated: Secondary | ICD-10-CM | POA: Diagnosis not present

## 2019-09-18 DIAGNOSIS — F332 Major depressive disorder, recurrent severe without psychotic features: Secondary | ICD-10-CM | POA: Diagnosis not present

## 2019-09-18 MED ORDER — NICOTINE 21 MG/24HR TD PT24
21.0000 mg | MEDICATED_PATCH | Freq: Every day | TRANSDERMAL | 0 refills | Status: DC
Start: 2019-09-19 — End: 2019-11-30

## 2019-09-18 MED ORDER — HYDROXYZINE HCL 25 MG PO TABS: 25.0000 mg | ORAL_TABLET | Freq: Three times a day (TID) | ORAL | 0 refills | Status: DC | PRN

## 2019-09-18 MED ORDER — CITALOPRAM HYDROBROMIDE 20 MG PO TABS: 20.0000 mg | ORAL_TABLET | Freq: Every day | ORAL | 0 refills | Status: DC

## 2019-09-18 MED ORDER — TRAZODONE HCL 50 MG PO TABS
50.0000 mg | ORAL_TABLET | Freq: Every evening | ORAL | 0 refills | Status: DC | PRN
Start: 2019-09-18 — End: 2019-11-30

## 2019-09-18 NOTE — [facility] Counselor (Signed)
[facility] LCSW Note  09/18/2019   5:00 PM  Type of Contact and Topic:  Discharge Disposition and SPE Attempt  CSW contact pt's identified supports, Brooke Bonito (friend/roommate) [phone], and Hessie Dibble [facility] Director) [phone]. Supports both detailed pt being a danger to herself, as well as Consulting civil engineer specifically reporting of having watched pt take a handful of pills after having drank all night in attempt to end her life. Victorino Dike Himes detailed having missed 13 calls from pt since she has been admitted, and having observed pt making threats and claims of wanting and intending to harm herself over the past week. Both supports detailed of pt having no approved bed with [facility] and pt having nowhere to stay following discharge. Doristine Johns Kitsner detailed pt would not be permitted to return to any [employer] for at least 14 days. Victorino Dike detailed pt has a sponsor, a mother, and sister however reported of believing the relationships to be damaged. Both Victorino Dike and [place] both reported not being willing to collect pt at time of discharge due to her needing continued treatment. When asked by CSW as to whether they believe pt would be a significant risk to herself if she were to discharge, both replied "Absolutely".  CSW confirmed with pt the lack of adequate planning surrounding discharge, thus further supporting need for continued stay throughout duration of recommended treatment.    Leisa Lenz, LCSW 09/18/2019  5:00 PM

## 2019-09-18 NOTE — [facility] Group Notes (Signed)
Date:  09/18/2019 Time:  1000-1045 Group Topic/Focus: PROGRESSIVE RELAXATION. A group where deep breathing is taught and tensing and relaxation muscle groups is used. Imagery is used as well.  Pts are asked to imagine 3 pillars that hold them up when they are not able to hold themselves up.  Participation Level:  Did not attend   Tina Torres A 09/18/2019    

## 2019-09-18 NOTE — [facility] Suicide Risk Assessment (Signed)
[facility] INPATIENT:  Family/Significant Other Suicide Prevention Education  Suicide Prevention Education:  Contact Attempts: Brooke Bonito (friend) [phone] and Kathrene Alu (friend) [phone], has been identified by the patient as the family member/significant other with whom the patient will be residing, and identified as the person(s) who will aid the patient in the event of a mental health crisis.  With written consent from the patient, two attempts were made to provide suicide prevention education, prior to and/or following the patient's discharge.  We were unsuccessful in providing suicide prevention education.  A suicide education pamphlet was given to the patient to share with family/significant other.  Date and time of first attempt: 09/18/19 11:08a. CSW left HIPPA compliant voicemails for both identified supports. CSW will make additional efforts at a later time.   Leisa Lenz 09/18/2019, 11:12 AM

## 2019-09-18 NOTE — Progress Notes (Signed)
   09/17/19 2205  Psych Admission Type (Psych Patients Only)  Admission Status Voluntary  Psychosocial Assessment  Patient Complaints None  Eye Contact Avoids  Facial Expression Flat  Affect Flat  Speech Logical/coherent  Interaction Assertive  Motor Activity Slow  Appearance/Hygiene Disheveled  Behavior Characteristics Appropriate to situation  Mood Depressed  Thought Process  Coherency WDL  Content WDL  Delusions None reported or observed  Perception WDL  Hallucination None reported or observed  Judgment Impaired  Confusion None  Danger to Self  Current suicidal ideation? Denies  Danger to Others  Danger to Others None reported or observed

## 2019-09-18 NOTE — Progress Notes (Signed)
D:  Patient's self inventory sheet, patient sleeps good, no sleep medication.  Good appetite, normal energy level, good concentration.  Denied depression, hopeless, and anxiety.  Denied withdrawals.  Denied SI.  Denied physical problems.  Denied physical pain.  Pain medicine is helpful.  Goal is discharge.  "I'm OK."  Does have discharge plans. A:  Medications administered per  MD orders.  Emotional support and encouragement given patient. R:  Denied SI and HI, contracts for safety.  Denied A/V hallucinations.  Safety maintained with 15 minute checks.

## 2019-09-18 NOTE — Progress Notes (Signed)
[facility] MD Progress Note  09/18/2019 3:24 PM Tina Torres  MRN:  [id number]  Subjective: Tina Torres reports, "I'm ready to be discharged today. I have to go back to work or I will lose the place I'm staying. I don't want to be homeless. I'm feeling good".  Objective: This is the second admission in this [facility] in a litt over 3 months for this 49 year old Caucasian female. She does have hx of polysubstance use disorders & other mental illness. She was treated & discharged from [facility] on 06-13-19 with a referral & an appointment to follow-up at the [facility]. Patient reports today that she did not make that appointment. Tina Torres is being admitted to the [facility] this time around with complaints of suicidal ideations with plan to overdose on medications. Reports indicated that she stated this was triggered by her recent relapse on alcohol after 6 months sobriety. Tina Torres is seen chart reviewed. The chart findings discussed with the treatment team. She is lying down in her bed. She denies any depression, anxiety or any other issues. She denies any withdrawal symptoms. She says she has been asking to be discharged since her admission yesterday morning or she will lose her job. Patient was very upset, was yelling & cursing yesterday because she wanted to go home to go back to work. She also added that she does have someone who will help her get direct admission to [facility] to continue substance abuse treatment if she could get out of here on time. She was almost IVC'ed yesterday to keep her admitted to the hospital for mood stabilization treatment because she attempted suicide by overdose on medications & alcohol. However, she did agree to saty through today to be re-evaluated. So today, patient denies any symptoms of depression, anxiety or SIHI. She was cleared for discharge. But, a collateral information from the manage of her half-way house showed concern that the chances that Tina Torres will commit suicide if discharged today is very  high as she has not been mentally stable for a while. This house manger also stated that Tina Torres is no longer welcomed back to their half-way house. This patient is currently homeless. She currently denies any SIHI, AVH, delusional thoughts or paranoia. She does not appear to be responding to any internal stimuli. She is doing well on her medications. Her discharge for today is cancelled based on the above available collateral information.  Principal Problem: MDD (major depressive disorder), recurrent episode, severe (HCC)  Diagnosis: Principal Problem:   MDD (major depressive disorder), recurrent episode, severe (HCC) Active Problems:   Polysubstance dependence including opioid type drug without complication, continuous use (HCC)   Alcohol use disorder, severe, dependence (HCC)   Overdose, intentional self-harm, initial encounter (HCC)  Total Time spent with patient: 25 minutes  Past Psychiatric History: See H&P  Past Medical History:  Past Medical History:  Diagnosis Date  . Depression   . Diverticulitis   . Seizures (HCC)     Past Surgical History:  Procedure Laterality Date  . COLOSTOMY REVERSAL      Family History: History reviewed. No pertinent family history.  Family Psychiatric  History: See H&P  Social History:  Social History   Substance and Sexual Activity  Alcohol Use Yes   Comment: last drink was 2/5th of liquor, been drinking "1 pint or more every day for a couple of weeks now"      Social History   Substance and Sexual Activity  Drug Use Yes  . Types: Other-see comments  Comment: "kratom," pt said it's synthetic heroin    Social History   Socioeconomic History  . Marital status: Single    Spouse name: Not on file  . Number of children: Not on file  . Years of education: Not on file  . Highest education level: Not on file  Occupational History  . Not on file  Tobacco Use  . Smoking status: Current Every Day Smoker    Packs/day: 1.00    Years:  0.00    Pack years: 0.00    Types: Cigarettes  . Smokeless tobacco: Never Used  Vaping Use  . Vaping Use: Never used  Substance and Sexual Activity  . Alcohol use: Yes    Comment: last drink was 2/5th of liquor, been drinking "1 pint or more every day for a couple of weeks now"   . 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 Sexual activity: Not Currently  Other Topics Concern  . Not on file  Social History Narrative  . Not on file   Social Determinants of Health   Financial Resource Strain:   . Difficulty of Paying Living Expenses: Not on file  Food Insecurity:   . Worried About Programme researcher, broadcasting/film/videounning Out of Food in the Last Year: Not on file  . Ran Out of Food in the Last Year: Not on file  Transportation Needs:   . Lack of Transportation (Medical): Not on file  . Lack of Transportation (Non-Medical): Not on file  Physical Activity:   . Days of Exercise per Week: Not on file  . Minutes of Exercise per Session: Not on file  Stress:   . Feeling of Stress : Not on file  Social Connections:   . Frequency of Communication with Friends and Family: Not on file  . Frequency of Social Gatherings with Friends and Family: Not on file  . Attends Religious Services: Not on file  . Active Member of Clubs or Organizations: Not on file  . Attends BankerClub or Organization Meetings: Not on file  . Marital Status: Not on file   Additional Social History:   Sleep: Good  Appetite:  Good  Current Medications: Current Facility-Administered Medications  Medication Dose Route Frequency Provider Last Rate Last Admin  . acetaminophen (TYLENOL) tablet 650 mg  650 mg Oral Q6H PRN Patrcia Dollyate, Tina L, FNP   650 mg at 09/17/19 0643  . alum & mag hydroxide-simeth (MAALOX/MYLANTA) 200-200-20 MG/5ML suspension 30 mL  30 mL Oral Q4H PRN Patrcia Dollyate, Tina L, FNP      . chlordiazePOXIDE (LIBRIUM) capsule 25 mg  25 mg Oral Q6H PRN Patrcia Dollyate, Tina L, FNP      . chlordiazePOXIDE  (LIBRIUM) capsule 25 mg  25 mg Oral TID Patrcia Dollyate, Tina L, FNP   25 mg at 09/18/19 1153   Followed by  . [START ON 09/19/2019] chlordiazePOXIDE (LIBRIUM) capsule 25 mg  25 mg Oral BH-qamhs Patrcia Dollyate, Tina L, FNP   25 mg at 09/17/19 2208   Followed by  . [START ON 09/21/2019] chlordiazePOXIDE (LIBRIUM) capsule 25 mg  25 mg Oral Daily Patrcia Dollyate, Tina L, FNP      . citalopram (CELEXA) tablet 20 mg  20 mg Oral Daily Patrcia Dollyate, Tina L, FNP   20 mg at 09/18/19 0753  . haloperidol (HALDOL) tablet 5 mg  5 mg Oral Q6H PRN Mariel CraftMaurer, Sheila M, MD   5 mg at 09/18/19 1154   Or  . haloperidol  lactate (HALDOL) injection 5 mg  5 mg Intramuscular Q6H PRN Mariel Craft, MD      . hydrOXYzine (ATARAX/VISTARIL) tablet 25 mg  25 mg Oral TID PRN Patrcia Dolly, FNP   25 mg at 09/17/19 2209  . loperamide (IMODIUM) capsule 2-4 mg  2-4 mg Oral PRN Patrcia Dolly, FNP   2 mg at 09/16/19 2046  . magnesium hydroxide (MILK OF MAGNESIA) suspension 30 mL  30 mL Oral Daily PRN Patrcia Dolly, FNP      . multivitamin with minerals tablet 1 tablet  1 tablet Oral Daily Patrcia Dolly, FNP   1 tablet at 09/18/19 0753  . nicotine (NICODERM CQ - dosed in mg/24 hours) patch 21 mg  21 mg Transdermal Daily Mariel Craft, MD   21 mg at 09/18/19 0755  . ondansetron (ZOFRAN-ODT) disintegrating tablet 4 mg  4 mg Oral Q6H PRN Patrcia Dolly, FNP   4 mg at 09/17/19 0917  . pneumococcal 23 valent vaccine (PNEUMOVAX-23) injection 0.5 mL  0.5 mL Intramuscular Tomorrow-1000 Gillermo Murdoch, NP      . thiamine tablet 100 mg  100 mg Oral Daily Patrcia Dolly, FNP   100 mg at 09/18/19 0753  . traZODone (DESYREL) tablet 50 mg  50 mg Oral QHS PRN Patrcia Dolly, FNP   50 mg at 09/17/19 2209    Lab Results:  No results found for this or any previous visit (from the past 48 hour(s)). Blood Alcohol level:  Lab Results  Component Value Date   ETH 192 (H) 09/15/2019   ETH 27 (H) 08/31/2019   Metabolic Disorder Labs: Lab Results  Component Value Date   HGBA1C 5.3  06/12/2019   MPG 105.41 06/12/2019   No results found for: PROLACTIN Lab Results  Component Value Date   CHOL 126 06/12/2019   TRIG 219 (H) 06/12/2019   HDL 46 06/12/2019   CHOLHDL 2.7 06/12/2019   VLDL 44 (H) 06/12/2019   LDLCALC 36 06/12/2019   Physical Findings: AIMS:  , ,  ,  ,    CIWA:  CIWA-Ar Total: 1 COWS:     Musculoskeletal: Strength & Muscle Tone: within normal limits Gait & Station: normal Patient leans: N/A  Psychiatric Specialty Exam: Physical Exam Vitals and nursing note reviewed.  HENT:     Head: Normocephalic.     Mouth/Throat:     Pharynx: Oropharynx is clear.  Eyes:     Pupils: Pupils are equal, round, and reactive to light.  Neck:     Comments: Deferred Cardiovascular:     Rate and Rhythm: Normal rate.  Pulmonary:     Effort: Pulmonary effort is normal.  Genitourinary:    Comments: Deferred Musculoskeletal:        General: Normal range of motion.     Cervical back: Normal range of motion.  Skin:    General: Skin is warm and dry.  Neurological:     Mental Status: She is alert and oriented to person, place, and time.     Review of Systems  Constitutional: Negative for chills, diaphoresis and fever.  HENT: Negative for congestion, rhinorrhea, sneezing and sore throat.   Eyes: Negative for discharge.  Respiratory: Negative for chest tightness, shortness of breath and wheezing.   Cardiovascular: Negative for chest pain and palpitations.  Gastrointestinal: Negative for diarrhea, nausea and vomiting.  Endocrine: Negative for cold intolerance.  Genitourinary: Negative for difficulty urinating.  Musculoskeletal: Negative for arthralgias and myalgias.  Allergic/Immunologic:  Negative for environmental allergies and food allergies.  Neurological: Negative.   Psychiatric/Behavioral: Negative for agitation, behavioral problems, confusion, decreased concentration, dysphoric mood, hallucinations, self-injury, sleep disturbance and suicidal ideas. The  patient is not nervous/anxious and is not hyperactive.     Blood pressure 95/74, pulse 83, temperature 97.8 F (36.6 C), temperature source Oral, resp. rate 16, height 5\' 3"  (1.6 m), weight 75.3 kg, last menstrual period 08/22/2019, SpO2 95 %.Body mass index is 29.41 kg/m.  General Appearance: Casual  Eye Contact:  Good  Speech:  Normal Rate  Volume: Normal  Mood: "improving"  Affect:  Congruent  Thought Process:  Coherent and Descriptions of Associations: Circumstantial  Orientation:  Full (Time, Place, and Person)  Thought Content:  Logical  Suicidal Thoughts: Denies  Homicidal Thoughts:  No  Memory:  Immediate;  Fair Recent; Fair Remote; Fair  Judgement:  Impaired  Insight:  Lacking  Psychomotor Activity: Normal  Concentration:  Concentration: Fair and Attention Span: Fair  Recall:  08/24/2019 of Knowledge:  Fair  Language:  Fair  Akathisia:  Negative  Handed:  Right  AIMS (if indicated):     Assets:  Desire for Improvement Resilience  ADL's:  Intact  Cognition:  WNL    Sleep:  Number of Hours: 6   Treatment Plan Summary: Daily contact with patient to assess and evaluate symptoms and progress in treatment and Medication management.  - Continue inpatient hospitalization. - Will continue today 09/18/2019 plan as below except where it is noted.  Alcohol withdrawal management.    - Discontiued Librium detox protocols. No withdrawal symptoms present. Depression.     - Continue Citalopram 20 mg po daily Anxiety/agitation.     - Continue Vistaril 25 mg po tid prn. Insomnia.      - Continue Trazodone 50 mg po prn Q hs.        Continue Zofran-ODT 4 mg po Q 6 hrs prn for nausea.       Continue Nicotine patch 21 mg topically for Nicotine withdrawal symptoms.       Continue MOM 30 ml po daily prn for constipation.       Continue Imodium 2-4 mg po prn for diarrhea for 72 hrs.        Continue Thiamine 100 mg po daily for low thiamine.        Agitation.       Continue  Haldol 5 mg po or IM Q 6 hrs prn.  Encourage group participation. Discharge disposition plan ongoing.  11/18/2019, NP, PMHNP, FNP-BC. 09/18/2019, 3:24 PMPatient ID: [id number], female   DOB: [birth year], 49 y.o.   MRN: [id number]

## 2019-09-19 MED ORDER — LORAZEPAM 1 MG PO TABS
1.0000 mg | ORAL_TABLET | Freq: Four times a day (QID) | ORAL | Status: DC | PRN
  Administered 2019-09-19 – 2019-09-20 (×2): 1 mg via ORAL
  Filled 2019-09-19 (×2): qty 1

## 2019-09-19 MED ORDER — GABAPENTIN 300 MG PO CAPS
300.0000 mg | ORAL_CAPSULE | Freq: Three times a day (TID) | ORAL | 0 refills | Status: DC
Start: 2019-09-19 — End: 2019-11-30

## 2019-09-19 MED ORDER — GABAPENTIN 300 MG PO CAPS
300.0000 mg | ORAL_CAPSULE | Freq: Three times a day (TID) | ORAL | Status: DC
  Administered 2019-09-19: 300 mg via ORAL
  Filled 2019-09-19: qty 1
  Filled 2019-09-19 (×2): qty 42
  Filled 2019-09-19: qty 1
  Filled 2019-09-19: qty 42

## 2019-09-19 NOTE — [facility] Counselor (Signed)
CSW faxed referral to [facility]. CSW called by June at [facility]. Confirmed pt has appointment for 09/20/19 morning at 830-9am.

## 2019-09-19 NOTE — Progress Notes (Signed)
[facility] MD Progress Note  09/19/2019 12:32 PM Tina Torres  MRN:  [id number]  Subjective: Tina Torres reports, "I was drinking alcohol. I had relapsed. I'm waiting right now to get back into an [facility]. You have to wait 14 days. I still feel kind of irritable."  Objective: This is the second admission in this [facility] in a litt over 3 months for this 49 year old Caucasian female. She does have hx of polysubstance use disorders & other mental illness. She was treated & discharged from [facility] on 06-13-19 with a referral & an appointment to follow-up at the [facility] but did not make it to the appointment.  Tina Torres was admitted to the [facility] this time with complaints of suicidal ideations with plan to overdose on medications. Reports indicated that she stated this was triggered by her recent relapse on alcohol after 6 months sobriety. Patient  is seen and chart reviewed. The chart findings discussed with the treatment team. She is lying down in her bed. She denies any depression, anxiety or any other issues. She denies any withdrawal symptoms. She also added that she does have someone who will help her get direct admission to [facility] to continue substance abuse treatment if she could get out of here on time. She was almost IVC'ed over the weekend to keep her admitted to the hospital for mood stabilization treatment because she attempted suicide by overdose on medications & alcohol. So today, patient denies any symptoms of depression, anxiety or SIHI. She was cleared for discharge over the weekend. But, a collateral information from the manager of her half-way house that her social worker spoke to over the weekend showed concern that the chances that Tina Torres will commit suicide if discharged today is very high as she has not been mentally stable for a while. This house manger also stated that Tina Torres is no longer welcomed back to their half-way house. This patient is currently homeless. She currently denies any SIHI, AVH,  delusional thoughts or paranoia. She does not appear to be responding to any internal stimuli. She is doing well on her medications.   Principal Problem: MDD (major depressive disorder), recurrent episode, severe (HCC)  Diagnosis: Principal Problem:   MDD (major depressive disorder), recurrent episode, severe (HCC) Active Problems:   Polysubstance dependence including opioid type drug without complication, continuous use (HCC)   Alcohol use disorder, severe, dependence (HCC)   Overdose, intentional self-harm, initial encounter (HCC)  Total Time spent with patient: 15 minutes  Past Psychiatric History: See H&P  Past Medical History:  Past Medical History:  Diagnosis Date  . Depression   . Diverticulitis   . Seizures (HCC)     Past Surgical History:  Procedure Laterality Date  . COLOSTOMY REVERSAL      Family History: History reviewed. No pertinent family history.  Family Psychiatric  History: See H&P  Social History:  Social History   Substance and Sexual Activity  Alcohol Use Yes   Comment: last drink was 2/5th of liquor, been drinking "1 pint or more every day for a couple of weeks now"      Social History   Substance and Sexual Activity  Drug Use Yes  . 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 Marital status: Single    Spouse name: Not on file  . Number of children: Not on file  . Years of education: Not on file  . Highest  education level: Not on file  Occupational History  . Not on file  Tobacco Use  . Smoking status: Current Every Day Smoker    Packs/day: 1.00    Years: 0.00    Pack years: 0.00    Types: Cigarettes  . Smokeless tobacco: Never Used  Vaping Use  . Vaping Use: Never used  Substance and Sexual Activity  . Alcohol use: Yes    Comment: last drink was 2/5th of liquor, been drinking "1 pint or more every day for a couple of weeks now"   . Drug use: Yes     Types: Other-see comments    Comment: "kratom," pt said it's synthetic heroin  . Sexual activity: Not Currently  Other Topics Concern  . Not on file  Social History Narrative  . Not on file   Social Determinants of Health   Financial Resource Strain:   . Difficulty of Paying Living Expenses: Not on file  Food Insecurity:   . Worried About Programme researcher, broadcasting/film/video in the Last Year: Not on file  . Ran Out of Food in the Last Year: Not on file  Transportation Needs:   . Lack of Transportation (Medical): Not on file  . Lack of Transportation (Non-Medical): Not on file  Physical Activity:   . Days of Exercise per Week: Not on file  . Minutes of Exercise per Session: Not on file  Stress:   . Feeling of Stress : Not on file  Social Connections:   . Frequency of Communication with Friends and Family: Not on file  . Frequency of Social Gatherings with Friends and Family: Not on file  . Attends Religious Services: Not on file  . Active Member of Clubs or Organizations: Not on file  . Attends Banker Meetings: Not on file  . Marital Status: Not on file   Additional Social History:   Sleep: Good  Appetite:  Good  Current Medications: Current Facility-Administered Medications  Medication Dose Route Frequency Provider Last Rate Last Admin  . acetaminophen (TYLENOL) tablet 650 mg  650 mg Oral Q6H PRN Patrcia Dolly, FNP   650 mg at 09/17/19 0643  . alum & mag hydroxide-simeth (MAALOX/MYLANTA) 200-200-20 MG/5ML suspension 30 mL  30 mL Oral Q4H PRN Patrcia Dolly, FNP      . citalopram (CELEXA) tablet 20 mg  20 mg Oral Daily Patrcia Dolly, FNP   20 mg at 09/19/19 0825  . haloperidol (HALDOL) tablet 5 mg  5 mg Oral Q6H PRN Mariel Craft, MD   5 mg at 09/19/19 1154   Or  . haloperidol lactate (HALDOL) injection 5 mg  5 mg Intramuscular Q6H PRN Mariel Craft, MD      . hydrOXYzine (ATARAX/VISTARIL) tablet 25 mg  25 mg Oral TID PRN Patrcia Dolly, FNP   25 mg at 09/19/19 1155  .  loperamide (IMODIUM) capsule 2-4 mg  2-4 mg Oral PRN Patrcia Dolly, FNP   2 mg at 09/16/19 2046  . magnesium hydroxide (MILK OF MAGNESIA) suspension 30 mL  30 mL Oral Daily PRN Patrcia Dolly, FNP      . multivitamin with minerals tablet 1 tablet  1 tablet Oral Daily Patrcia Dolly, FNP   1 tablet at 09/19/19 0825  . nicotine (NICODERM CQ - dosed in mg/24 hours) patch 21 mg  21 mg Transdermal Daily Mariel Craft, MD   21 mg at 09/19/19 0826  . ondansetron (ZOFRAN-ODT) disintegrating tablet  4 mg  4 mg Oral Q6H PRN Patrcia Dolly, FNP   4 mg at 09/17/19 0917  . pneumococcal 23 valent vaccine (PNEUMOVAX-23) injection 0.5 mL  0.5 mL Intramuscular Tomorrow-1000 Gillermo Murdoch, NP      . thiamine tablet 100 mg  100 mg Oral Daily Patrcia Dolly, FNP   100 mg at 09/19/19 0825  . traZODone (DESYREL) tablet 50 mg  50 mg Oral QHS PRN Patrcia Dolly, FNP   50 mg at 09/18/19 2157    Lab Results:  No results found for this or any previous visit (from the past 48 hour(s)). Blood Alcohol level:  Lab Results  Component Value Date   ETH 192 (H) 09/15/2019   ETH 27 (H) 08/31/2019   Metabolic Disorder Labs: Lab Results  Component Value Date   HGBA1C 5.3 06/12/2019   MPG 105.41 06/12/2019   No results found for: PROLACTIN Lab Results  Component Value Date   CHOL 126 06/12/2019   TRIG 219 (H) 06/12/2019   HDL 46 06/12/2019   CHOLHDL 2.7 06/12/2019   VLDL 44 (H) 06/12/2019   LDLCALC 36 06/12/2019   Physical Findings: AIMS:  , ,  ,  ,    CIWA:  CIWA-Ar Total: 2 COWS:     Musculoskeletal: Strength & Muscle Tone: within normal limits Gait & Station: normal Patient leans: N/A  Psychiatric Specialty Exam: Physical Exam Vitals and nursing note reviewed.  HENT:     Head: Normocephalic.     Mouth/Throat:     Pharynx: Oropharynx is clear.  Eyes:     Pupils: Pupils are equal, round, and reactive to light.  Neck:     Comments: Deferred Cardiovascular:     Rate and Rhythm: Normal rate.   Pulmonary:     Effort: Pulmonary effort is normal.  Genitourinary:    Comments: Deferred Musculoskeletal:        General: Normal range of motion.     Cervical back: Normal range of motion.  Skin:    General: Skin is warm and dry.  Neurological:     Mental Status: She is alert and oriented to person, place, and time.     Review of Systems  Constitutional: Negative for chills, diaphoresis and fever.  HENT: Negative for congestion, rhinorrhea, sneezing and sore throat.   Eyes: Negative for discharge.  Respiratory: Negative for chest tightness, shortness of breath and wheezing.   Cardiovascular: Negative for chest pain and palpitations.  Gastrointestinal: Negative for diarrhea, nausea and vomiting.  Endocrine: Negative for cold intolerance.  Genitourinary: Negative for difficulty urinating.  Musculoskeletal: Negative for arthralgias and myalgias.  Allergic/Immunologic: Negative for environmental allergies and food allergies.  Neurological: Negative.   Psychiatric/Behavioral: Negative for agitation, behavioral problems, confusion, decreased concentration, dysphoric mood, hallucinations, self-injury, sleep disturbance and suicidal ideas. The patient is not nervous/anxious and is not hyperactive.     Blood pressure 101/66, pulse 83, temperature 97.6 F (36.4 C), temperature source Oral, resp. rate 20, height 5\' 3"  (1.6 m), weight 75.3 kg, last menstrual period 08/22/2019, SpO2 (!) 82 %.Body mass index is 29.41 kg/m.  General Appearance: Casual  Eye Contact:  Good  Speech:  Normal Rate  Volume: Normal  Mood: "improving"  Affect:  Congruent  Thought Process:  Coherent and Descriptions of Associations: Circumstantial  Orientation:  Full (Time, Place, and Person)  Thought Content:  Logical  Suicidal Thoughts: Denies  Homicidal Thoughts:  No  Memory:  Immediate;  Fair Recent; Fair Remote; Fair  Judgement:  Impaired  Insight:  Lacking  Psychomotor Activity: Normal  Concentration:   Concentration: Fair and Attention Span: Fair  Recall:  FiservFair  Fund of Knowledge:  Fair  Language:  Fair  Akathisia:  Negative  Handed:  Right  AIMS (if indicated):     Assets:  Desire for Improvement Resilience  ADL's:  Intact  Cognition:  WNL    Sleep:  Number of Hours: 6.75   Treatment Plan Summary: Daily contact with patient to assess and evaluate symptoms and progress in treatment and Medication management.  - Continue inpatient hospitalization. - Will continue today 09/19/2019 plan as below except where it is noted.  Depression.     - Continue Citalopram 20 mg po daily Anxiety/agitation.     - Continue Vistaril 25 mg po tid prn. Insomnia.      - Continue Trazodone 50 mg po prn Q hs.        Continue Zofran-ODT 4 mg po Q 6 hrs prn for nausea.       Continue Nicotine patch 21 mg topically for Nicotine withdrawal symptoms.       Continue MOM 30 ml po daily prn for constipation.       Continue Imodium 2-4 mg po prn for diarrhea for 72 hrs.        Continue Thiamine 100 mg po daily for low thiamine.        Agitation.       Continue Haldol 5 mg po or IM Q 6 hrs prn.  Encourage group participation. Discharge disposition plan ongoing. Social worker is actively working on housing issue and plan to maintain patient safety until she can return to the [facility].  Fransisca KaufmannAVIS, Johann Santone, NP, PMHNP 09/19/2019, 12:32 PM

## 2019-09-19 NOTE — [facility] Group Notes (Signed)
Occupational Therapy Group Note Date: 09/19/2019 Group Topic/Focus: Brain Fitness  Group Description: Group encouraged increased social engagement and participation through discussion/activity focused on brain fitness. Patients were provided education on various brain fitness activities/strategies, with explanation provided on the qualifying factors including: one, that is has to be challenging/hard and two, it has to be something that you do not do every day. Patients engaged actively during group session in various brain fitness activities to increase attention, concentration, and problem-solving skills. Discussion followed with a focus on identifying the benefits of brain fitness activities as use for adaptive coping strategies and distraction.   Participation Level: Pt was invited however did not attend this OT session.   Plan: Continue to engage patient in OT groups 2 - 3x/week.  09/19/2019  Ragan Duhon, MOT, OTR/L  

## 2019-09-19 NOTE — Progress Notes (Signed)
Spiritual care group on grief and loss facilitated by chaplain Lylah Lantis  Group Goal:  Support / Education around grief and loss Members engage in facilitated group support and psycho-social education.  Group Description:  Following introductions and group rules, group members engaged in facilitated group dialog and support around topic of loss, with particular support around experiences of loss in their lives. Group Identified types of loss (relationships / self / things) and identified patterns, circumstances, and changes that precipitate losses. Reflected on thoughts / feelings around loss, normalized grief responses, and recognized variety in grief experience. Patient Progress: Did not attend  

## 2019-09-19 NOTE — Progress Notes (Signed)
D:  Patient denied SI and HI, contracts for safety.  Denied A/V hallucinations. A:  Medications administered per MD orders.  Emotional support and encouragement given patient. R:  Safety maintained with 15 minute checks. Patient has been in bed most the day.  Patient did walk to dining room for her lunch.

## 2019-09-19 NOTE — Progress Notes (Signed)
Patient stated she was prescribed neurotin 400 mg every day for several months and is not getting neurotin here at [facility].  Can this be ordered for her?

## 2019-09-19 NOTE — Progress Notes (Signed)
Pt has been calm and cooperative tonight. No disruptive behaviors have been observed. Pt has been isolative to her room, resting in bed. Pt said that she felt "tired" and was very minimal tonight. Pt complained of anxiety rated an 8 on a scale of 0-10 (10 being the worse) for which her PRN vistaril was administered. Pt is med seeking and requested her Ativan which is ordered PRN for agitation. Pt didn't voice any further complaints or concerns. Pt denies SI/HI and AVH. Medications administered as ordered by MD. Active listening, reassurance, and support provided. Q 15 min safety checks continue. Pt's safety has been maintained.    09/19/19 2127  Psych Admission Type (Psych Patients Only)  Admission Status Voluntary  Psychosocial Assessment  Patient Complaints Anxiety;Depression;Sadness  Eye Contact Avoids  Facial Expression Flat  Affect Flat  Speech Logical/coherent  Interaction Minimal;Isolative  Motor Activity Slow  Appearance/Hygiene Disheveled  Behavior Characteristics Anxious  Mood Anxious;Depressed;Sad  Thought Process  Coherency WDL  Content WDL  Delusions None reported or observed  Perception WDL  Hallucination None reported or observed  Judgment Impaired  Confusion None  Danger to Self  Current suicidal ideation? Denies  Danger to Others  Danger to Others None reported or observed

## 2019-09-19 NOTE — Progress Notes (Signed)
Patient has been isolative to her room most of the evening only coming out for snacks and medicines. She reports wanting to return to the [facility] but can't return for 14 days. Patient reports no where to go upon discharge. Encouraged to speak with her Child psychotherapist.

## 2019-09-19 NOTE — Progress Notes (Signed)
   09/18/19 0028  Psych Admission Type (Psych Patients Only)  Admission Status Voluntary  Psychosocial Assessment  Patient Complaints Worrying  Eye Contact Avoids  Facial Expression Flat  Affect Flat  Speech Logical/coherent  Interaction Assertive  Motor Activity Slow  Appearance/Hygiene Disheveled  Behavior Characteristics Appropriate to situation;Cooperative  Mood Depressed;Preoccupied;Sad;Pleasant  Thought Process  Coherency WDL  Content WDL  Delusions None reported or observed  Perception WDL  Hallucination None reported or observed  Judgment Impaired  Confusion None  Danger to Self  Current suicidal ideation? Denies  Danger to Others  Danger to Others None reported or observed

## 2019-09-19 NOTE — Progress Notes (Signed)
   09/19/19 2035  COVID-19 Daily Checkoff  Have you had a fever (temp > 37.80C/100F)  in the past 24 hours?  No  COVID-19 EXPOSURE  Have you traveled outside the state in the past 14 days? No  Have you been in contact with someone with a confirmed diagnosis of COVID-19 or PUI in the past 14 days without wearing appropriate PPE? No  Have you been living in the same home as a person with confirmed diagnosis of COVID-19 or a PUI (household contact)? No  Have you been diagnosed with COVID-19? No

## 2019-09-19 NOTE — Progress Notes (Signed)
Patient told nurse that she needed medicine to knock her out, felt like picking up chair and throwing it, tearing place up.   Nurse gave patient ativan 1 mg p.o. per MD order.  Also gave pt her 1700 neurotin 300 mg p.o.  Patient stated she was taking a lot more than 300 mg neurotin daily.  Pl walked down hallway and then went in her room.

## 2019-09-19 NOTE — Tx Team (Signed)
Interdisciplinary Treatment and Diagnostic Plan Update  09/19/2019 Time of Session: 3:10PM Tina Torres MRN: [id number]  Principal Diagnosis: MDD (major depressive disorder), recurrent episode, severe (HCC)  Secondary Diagnoses: Principal Problem:   MDD (major depressive disorder), recurrent episode, severe (HCC) Active Problems:   Polysubstance dependence including opioid type drug without complication, continuous use (HCC)   Alcohol use disorder, severe, dependence (HCC)   Overdose, intentional self-harm, initial encounter (HCC)   Current Medications:  Current Facility-Administered Medications  Medication Dose Route Frequency Provider Last Rate Last Admin  . acetaminophen (TYLENOL) tablet 650 mg  650 mg Oral Q6H PRN Patrcia Dolly, FNP   650 mg at 09/17/19 0643  . alum & mag hydroxide-simeth (MAALOX/MYLANTA) 200-200-20 MG/5ML suspension 30 mL  30 mL Oral Q4H PRN Patrcia Dolly, FNP      . citalopram (CELEXA) tablet 20 mg  20 mg Oral Daily Patrcia Dolly, FNP   20 mg at 09/19/19 0825  . gabapentin (NEURONTIN) capsule 300 mg  300 mg Oral TID Antonieta Pert, MD      . haloperidol (HALDOL) tablet 5 mg  5 mg Oral Q6H PRN Mariel Craft, MD   5 mg at 09/19/19 1154   Or  . haloperidol lactate (HALDOL) injection 5 mg  5 mg Intramuscular Q6H PRN Mariel Craft, MD      . hydrOXYzine (ATARAX/VISTARIL) tablet 25 mg  25 mg Oral TID PRN Patrcia Dolly, FNP   25 mg at 09/19/19 1155  . loperamide (IMODIUM) capsule 2-4 mg  2-4 mg Oral PRN Patrcia Dolly, FNP   2 mg at 09/16/19 2046  . magnesium hydroxide (MILK OF MAGNESIA) suspension 30 mL  30 mL Oral Daily PRN Patrcia Dolly, FNP      . multivitamin with minerals tablet 1 tablet  1 tablet Oral Daily Patrcia Dolly, FNP   1 tablet at 09/19/19 0825  . nicotine (NICODERM CQ - dosed in mg/24 hours) patch 21 mg  21 mg Transdermal Daily Mariel Craft, MD   21 mg at 09/19/19 0826  . ondansetron (ZOFRAN-ODT) disintegrating tablet 4 mg  4 mg Oral Q6H PRN  Patrcia Dolly, FNP   4 mg at 09/17/19 0917  . pneumococcal 23 valent vaccine (PNEUMOVAX-23) injection 0.5 mL  0.5 mL Intramuscular Tomorrow-1000 Gillermo Murdoch, NP      . thiamine tablet 100 mg  100 mg Oral Daily Patrcia Dolly, FNP   100 mg at 09/19/19 0825  . traZODone (DESYREL) tablet 50 mg  50 mg Oral QHS PRN Patrcia Dolly, FNP   50 mg at 09/18/19 2157   PTA Medications: Medications Prior to Admission  Medication Sig Dispense Refill Last Dose  . gabapentin (NEURONTIN) 300 MG capsule Take 1 capsule (300 mg total) by mouth 2 (two) times daily. 120 capsule 2   . gabapentin (NEURONTIN) 400 MG capsule Take 1 capsule (400 mg total) by mouth daily. (Patient taking differently: Take 400 mg by mouth at bedtime. ) 30 capsule 2   . HYDROcodone-acetaminophen (NORCO/VICODIN) 5-325 MG tablet Take 1 tablet by mouth every 6 (six) hours as needed for severe pain. (Patient not taking: Reported on 09/15/2019) 10 tablet 0   . [DISCONTINUED] citalopram (CELEXA) 20 MG tablet Take 1 tablet (20 mg total) by mouth daily. 30 tablet 2   . [DISCONTINUED] hydrOXYzine (ATARAX/VISTARIL) 25 MG tablet Take 1 tablet (25 mg total) by mouth 3 (three) times daily as needed for anxiety. 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   Physician Treatment Plan for Primary Diagnosis: MDD (major depressive disorder), recurrent episode, severe ([place]) Long Term Goal(s): Improvement in symptoms so as ready for discharge Improvement in symptoms so as ready for discharge   Short Term Goals: Ability to identify changes in lifestyle to reduce recurrence of condition will improve Ability to verbalize feelings will improve Ability  to disclose and discuss suicidal ideas Ability to demonstrate self-control will improve Ability to identify and develop effective coping behaviors will improve Compliance with prescribed medications will improve Ability to identify triggers associated with substance abuse/mental health issues will improve  Medication Management: Evaluate patient's response, side effects, and tolerance of medication regimen.  Therapeutic Interventions: 1 to 1 sessions, Unit Group sessions and Medication administration.  Evaluation of Outcomes: Not Met  Physician Treatment Plan for Secondary Diagnosis: Principal Problem:   MDD (major depressive disorder), recurrent episode, severe ([place]) Active Problems:   Polysubstance dependence including opioid type drug without complication, continuous use (HCC)   Alcohol use disorder, severe, dependence ([place])   Overdose, intentional self-harm, initial encounter ([place])  Long Term Goal(s): Improvement in symptoms so as ready for discharge Improvement in symptoms so as ready for discharge   Short Term Goals: Ability to identify changes in lifestyle to reduce recurrence of condition will improve Ability to verbalize feelings will improve Ability to disclose and discuss suicidal ideas Ability to demonstrate self-control will improve Ability to identify and develop effective coping behaviors will improve Compliance with prescribed medications will improve Ability to identify triggers associated with substance abuse/mental health issues will improve     Medication Management: Evaluate patient's response, side effects, and tolerance of medication regimen.  Therapeutic Interventions: 1 to 1 sessions, Unit Group sessions and Medication administration.  Evaluation of Outcomes: Not Met   RN Treatment Plan for Primary Diagnosis: MDD (major depressive disorder), recurrent episode, severe ([place]) Long Term Goal(s): Knowledge of disease and therapeutic regimen to maintain health  will improve  Short Term Goals: Ability to remain free from injury will improve, Ability to verbalize feelings will improve, Ability to identify and develop effective coping behaviors will improve and Compliance with prescribed medications will improve  Medication Management: RN will administer medications as ordered by provider, will assess and evaluate patient's response and provide education to patient for prescribed medication. RN will report any adverse and/or side effects to prescribing provider.  Therapeutic Interventions: 1 on 1 counseling sessions, Psychoeducation, Medication administration, Evaluate responses to treatment, Monitor vital signs and CBGs as ordered, Perform/monitor CIWA, COWS, AIMS and Fall Risk screenings as ordered, Perform wound care treatments as ordered.  Evaluation of Outcomes: Progressing   LCSW Treatment Plan for Primary Diagnosis: MDD (major depressive disorder), recurrent episode, severe ([place]) Long Term Goal(s): Safe transition to appropriate next level of care at discharge, Engage patient in therapeutic group addressing interpersonal concerns.  Short Term Goals: Engage patient in aftercare planning with referrals and resources, Increase emotional regulation, Facilitate patient progression through stages of change regarding substance use diagnoses and concerns and Increase skills for wellness and recovery  Therapeutic Interventions: Assess for all discharge needs, 1 to 1 time with Social worker, Explore available resources and support systems, Assess for adequacy in community support network, Educate family and significant other(s)  on suicide prevention, Complete Psychosocial Assessment, Interpersonal group therapy.  Evaluation of Outcomes: Progressing   Progress in Treatment: Attending groups: No. Participating in groups: No. Taking medication as prescribed: Yes. Toleration medication: Yes. Family/Significant other contact made: No, will contact:  Pt's  friend. Patient understands diagnosis: Yes. Discussing patient identified problems/goals with staff: Yes. Medical problems stabilized or resolved: Yes. Denies suicidal/homicidal ideation: Yes. Issues/concerns per patient self-inventory: No. Other: None  New problem(s) identified: No, Describe:  none  New Short Term/Long Term Goal(s): SW assist Pt in obtaining appropriate follow-up services for continuation of mood stabilization post discharge.  Patient Goals:  "To get out, get my truck and go to [facility]"  Discharge Plan or Barriers: Pt will stay with a friend post discharge.  Reason for Continuation of Hospitalization: Anxiety Medication stabilization  Estimated Length of Stay: 3-5 Days  Attendees: Patient: Tina Torres 09/19/2019 3:58 PM  Physician: Dr. Mallie Darting 09/19/2019 3:58 PM  Nursing:  09/19/2019 3:58 PM  RN Care Manager: 09/19/2019 3:58 PM  Social Worker: Freddi Che, LCSW 09/19/2019 3:58 PM  Recreational Therapist:  09/19/2019 3:58 PM  Other:  09/19/2019 3:58 PM  Other:  09/19/2019 3:58 PM  Other: 09/19/2019 3:58 PM    Scribe for Treatment Team: Freddi Che, LCSW 09/19/2019 3:58 PM

## 2019-09-19 NOTE — Progress Notes (Signed)
Recreation Therapy Notes  Date: 9.13.21 Time: 0930 Location: [address] Dayroom  Group Topic: Stress Management  Goal Area(s) Addresses:  Patient will identify positive stress management techniques. Patient will identify benefits of using stress management post d/c.  Intervention: Stress Management  Activity: Meditation.  LRT played a meditation that focused on being more resilient in the face adversity.  Patients were to listen and follow along as the meditation played to engage in meditation.    Education:  Stress Management, Discharge Planning.   Education Outcome: Acknowledges Education  Clinical Observations/Feedback: Pt did not attend group activity.    Raetta Agostinelli, LRT/CTRS         Yazmeen Woolf A 09/19/2019 11:50 AM 

## 2019-09-19 NOTE — Progress Notes (Signed)
Psychoeducational Group Note  Date:  09/19/2019 Time:  2144  Group Topic/Focus:  Wrap-Up Group:   The focus of this group is to help patients review their daily goal of treatment and discuss progress on daily workbooks.  Participation Level: Did Not Attend  Participation Quality:  Not Applicable  Affect:  Not Applicable  Cognitive:  Not Applicable  Insight:  Not Applicable  Engagement in Group: Not Applicable  Additional Comments:  Patient did not attend group this evening.   Hazle Coca S 09/19/2019, 9:44 PM

## 2019-09-19 NOTE — [facility] Suicide Risk Assessment (Signed)
[facility] Discharge Suicide Risk Assessment   Principal Problem: MDD (major depressive disorder), recurrent episode, severe (HCC) Discharge Diagnoses: Principal Problem:   MDD (major depressive disorder), recurrent episode, severe (HCC) Active Problems:   Polysubstance dependence including opioid type drug without complication, continuous use (HCC)   Alcohol use disorder, severe, dependence (HCC)   Overdose, intentional self-harm, initial encounter (HCC)   Total Time spent with patient: 15 minutes  Musculoskeletal: Strength & Muscle Tone: within normal limits Gait & Station: normal Patient leans: N/A  Psychiatric Specialty Exam: Review of Systems  All other systems reviewed and are negative.   Blood pressure 101/66, pulse 83, temperature 97.6 F (36.4 C), temperature source Oral, resp. rate 20, height 5\' 3"  (1.6 m), weight 75.3 kg, last menstrual period 08/22/2019, SpO2 (!) 82 %.Body mass index is 29.41 kg/m.  General Appearance: Disheveled  Eye 08/24/2019::  Fair  Speech:  Normal Rate409  Volume:  Normal  Mood:  Anxious  Affect:  Congruent  Thought Process:  Coherent and Descriptions of Associations: Intact  Orientation:  Full (Time, Place, and Person)  Thought Content:  Logical  Suicidal Thoughts:  No  Homicidal Thoughts:  No  Memory:  Immediate;   Fair Recent;   Fair Remote;   Fair  Judgement:  Intact  Insight:  Fair  Psychomotor Activity:  Normal  Concentration:  Fair  Recall:  [id number] of Knowledge:Fair  Language: Good  Akathisia:  Negative  Handed:  Right  AIMS (if indicated):     Assets:  Desire for Improvement Resilience  Sleep:  Number of Hours: 6.75  Cognition: WNL  ADL's:  Intact   Mental Status Per Nursing Assessment::   On Admission:  Suicidal ideation indicated by patient  Demographic Factors:  Divorced or widowed, Caucasian, Low socioeconomic status, Living alone and Unemployed  Loss Factors: NA  Historical Factors: Impulsivity  Risk  Reduction Factors:   Positive coping skills or problem solving skills  Continued Clinical Symptoms:  Depression:   Comorbid alcohol abuse/dependence Impulsivity Alcohol/Substance Abuse/Dependencies  Cognitive Features That Contribute To Risk:  None    Suicide Risk:  Minimal: No identifiable suicidal ideation.  Patients presenting with no risk factors but with morbid ruminations; may be classified as minimal risk based on the severity of the depressive symptoms   Follow-up Information    [facility]. Go on 09/21/2019.   Specialty: Urgent Care Why: You have an appointment for therapy on 09/21/19 at 9:00 am.  You also have a walk in appointment for medication management on 10/20/19 at 8:00 am with 10/22/19, NP.  These appointments will be held in person.  Contact information: [address] [place] [phone]              Plan Of Care/Follow-up recommendations:  Activity:  ad lib  [phone], MD 09/19/2019, 4:02 PM

## 2019-09-20 NOTE — Progress Notes (Signed)
Pt provided AVS discharge packet after discussing follow-up appointments and medications to be continued upon discharge. Educated about potential adverse effects that need to be reported to her provider ASAP. Provided patient [employer] education handouts on her medications. Pt also educated about not drinking ETOH or illegal drugs while taking her prescription medications. Pt has been provided her suicide risk assessment completed by her MD and transition record with her lab results. Provided opportunity to ask any questions. Pt has gathered her belongings from her room and will gather secured belongings from assigned locker with day shift RN. Pt has been provided resources/contact numbers to reach out to for suicidal thoughts and mental health related needs. Pt denies SI/HI and AVH.

## 2019-09-20 NOTE — Discharge Summary (Signed)
Physician Discharge Summary Note  Patient:  Tina Torres is an 49 y.o., female MRN:  [id number] DOB:  08-21-1970 Patient phone:  [phone] (home)  Patient address:   [address] [place] [id number],  Total Time spent with patient: 30 minutes  Date of Admission:  09/16/2019 Date of Discharge: 09/20/2019.  Reason for Admission: Suicidal ideation and alcohol dependence  Principal Problem: MDD (major depressive disorder), recurrent episode, severe (HCC) Discharge Diagnoses: Principal Problem:   MDD (major depressive disorder), recurrent episode, severe (HCC) Active Problems:   Polysubstance dependence including opioid type drug without complication, continuous use (HCC)   Alcohol use disorder, severe, dependence (HCC)   Overdose, intentional self-harm, initial encounter [facility](HCC)   Past Psychiatric History: See admission H&P  Past Medical History:  Past Medical History:  Diagnosis Date  . Depression   . Diverticulitis   . Seizures (HCC)     Past Surgical History:  Procedure Laterality Date  . COLOSTOMY REVERSAL     Family History: History reviewed. No pertinent family history. Family Psychiatric  History: See admission H&P Social History:  Social History   Substance and Sexual Activity  Alcohol Use Yes   Comment: last drink was 2/5th of liquor, been drinking "1 pint or more every day for a couple of weeks now"      Social History   Substance and Sexual Activity  Drug Use Yes  . 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 Marital status: Single    Spouse name: Not on file  . Number of children: Not on file  . Years of education: Not on file  . Highest education level: Not on file  Occupational History  . Not on file  Tobacco Use  . Smoking status: Current Every Day Smoker    Packs/day: 1.00    Years: 0.00    Pack years: 0.00    Types: Cigarettes  . Smokeless tobacco:  Never Used  Vaping Use  . Vaping Use: Never used  Substance and Sexual Activity  . Alcohol use: Yes    Comment: last drink was 2/5th of liquor, been drinking "1 pint or more every day for a couple of weeks now"   . 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 Sexual activity: Not Currently  Other Topics Concern  . Not on file  Social History Narrative  . Not on file   Social Determinants of Health   Financial Resource Strain:   . Difficulty of Paying Living Expenses: Not on file  Food Insecurity:   . Worried About Programme researcher, broadcasting/film/videounning Out of Food in the Last Year: Not on file  . Ran Out of Food in the Last Year: Not on file  Transportation Needs:   . Lack of Transportation (Medical): Not on file  . Lack of Transportation (Non-Medical): Not on file  Physical Activity:   . Days of Exercise per Week: Not on file  . Minutes of Exercise per Session: Not on file  Stress:   . Feeling of Stress : Not on file  Social Connections:   . Frequency of Communication with Friends and Family: Not on file  . Frequency of Social Gatherings with Friends and Family: Not on file  . Attends Religious Services: Not on file  . Active Member of Clubs or Organizations: Not on file  . Attends Club  or Organization Meetings: Not on file  . Marital Status: Not on file    Hospital Course: Patient is seen and examined.  Patient is a 49 year old female who presented to the [facility] emergency department on 09/16/2019 after an intentional overdose of gabapentin and opiates.  The blood alcohol on admission was 192, but the patient was too sedated originally to be evaluated.  The patient admitted she had recently relapsed on alcohol and had a verbal altercation with a friend.  The patient had been residing in a sober living community and after had relapsed after 6 months for sobriety.  She had ingested over 1/5 of vodka prior to arrival.  On admission she  presented with a history of depression and had received outpatient services from [facility].  She was admitted to the hospital for evaluation and stabilization.  She was placed on Librium detox protocol.  Her Celexa was restarted.  Initially her gabapentin was held secondary to the overdose, but that was restarted prior to discharge.  She had requested to enter the residential substance abuse treatment program at [facility].  She did well during the course of the hospitalization.  She showed no major signs or symptoms of opiate withdrawal as well as benzodiazepine withdrawal or alcohol withdrawal.  On 09/19/2019 she was notified that she was excepted into the residential program at [facility].  On 09/20/2019 it was decided that she could be discharged to their facility for residential treatment.  On the morning of 9/14 her vital signs were stable, she was afebrile.  Review of her admission laboratories revealed a mildly elevated white blood cell count at 14.2.  Blood alcohol on admission was 192.  Drug screen was negative, but she had been using synthetic Kratom.  Physical Findings: AIMS: Facial and Oral Movements Muscles of Facial Expression: None, normal Lips and Perioral Area: None, normal Jaw: None, normal Tongue: None, normal,Extremity Movements Upper (arms, wrists, hands, fingers): None, normal Lower (legs, knees, ankles, toes): None, normal, Trunk Movements Neck, shoulders, hips: None, normal, Overall Severity Severity of abnormal movements (highest score from questions above): None, normal Incapacitation due to abnormal movements: None, normal Patient's awareness of abnormal movements (rate only patient's report): No Awareness, Dental Status Current problems with teeth and/or dentures?: No Does patient usually wear dentures?: No  CIWA:  CIWA-Ar Total: 4 COWS:     Musculoskeletal: Strength & Muscle Tone: within normal limits Gait & Station: normal Patient leans: N/A  Psychiatric Specialty  Exam: Physical Exam Vitals and nursing note reviewed.  Constitutional:      Appearance: Normal appearance.  HENT:     Head: Normocephalic and atraumatic.  Pulmonary:     Effort: Pulmonary effort is normal.  Neurological:     General: No focal deficit present.     Mental Status: She is alert.     Review of Systems  Blood pressure (!) 92/56, pulse 99, temperature 97.6 F (36.4 C), temperature source Oral, resp. rate 20, height 5\' 3"  (1.6 m), weight 75.3 kg, last menstrual period 08/22/2019, SpO2 (!) 82 %.Body mass index is 29.41 kg/m.  General Appearance: Disheveled  Eye Contact:  Fair  Speech:  Normal Rate  Volume:  Normal  Mood:  Anxious  Affect:  Congruent  Thought Process:  Coherent and Descriptions of Associations: Intact  Orientation:  Full (Time, Place, and Person)  Thought Content:  Logical  Suicidal Thoughts:  No  Homicidal Thoughts:  No  Memory:  Immediate;   Fair Recent;   Fair Remote;  Fair  Judgement:  Intact  Insight:  Fair  Psychomotor Activity:  Normal  Concentration:  Concentration: Fair and Attention Span: Fair  Recall:  Fair  Fund of Knowledge:  Good  Language:  Good  Akathisia:  Negative  Handed:  Right  AIMS (if indicated):     Assets:  Desire for Improvement Resilience  ADL's:  Intact  Cognition:  WNL  Sleep:  Number of Hours: 6.75        Has this patient used any form of tobacco in the last 30 days? (Cigarettes, Smokeless Tobacco, Cigars, and/or Pipes) Yes, No  Blood Alcohol level:  Lab Results  Component Value Date   ETH 192 (H) 09/15/2019   ETH 27 (H) 08/31/2019    Metabolic Disorder Labs:  Lab Results  Component Value Date   HGBA1C 5.3 06/12/2019   MPG 105.41 06/12/2019   No results found for: PROLACTIN Lab Results  Component Value Date   CHOL 126 06/12/2019   TRIG 219 (H) 06/12/2019   HDL 46 06/12/2019   CHOLHDL 2.7 06/12/2019   VLDL 44 (H) 06/12/2019   LDLCALC 36 06/12/2019    See Psychiatric Specialty Exam and  Suicide Risk Assessment completed by Attending Physician prior to discharge.  Discharge destination:  [facility]  Is patient on multiple antipsychotic therapies at discharge:  No   Has Patient had three or more failed trials of antipsychotic monotherapy by history:  No  Recommended Plan for Multiple Antipsychotic Therapies: NA  Discharge Instructions    Diet - low sodium heart healthy   Complete by: As directed    Increase activity slowly   Complete by: As directed      Allergies as of 09/20/2019   No Known Allergies     Medication List    STOP taking these medications   HYDROcodone-acetaminophen 5-325 MG tablet Commonly known as: NORCO/VICODIN     TAKE these medications     Indication  citalopram 20 MG tablet Commonly known as: CELEXA Take 1 tablet (20 mg total) by mouth daily. For depression What changed: additional instructions  Indication: Depression   gabapentin 300 MG capsule Commonly known as: NEURONTIN Take 1 capsule (300 mg total) by mouth 3 (three) times daily. What changed:   when to take this  Another medication with the same name was removed. Continue taking this medication, and follow the directions you see here.  Indication: Abuse or Misuse of Alcohol   hydrOXYzine 25 MG tablet Commonly known as: ATARAX/VISTARIL Take 1 tablet (25 mg total) by mouth 3 (three) times daily as needed for anxiety.  Indication: Feeling Anxious   nicotine 21 mg/24hr patch Commonly known as: NICODERM CQ - dosed in mg/24 hours Place 1 patch (21 mg total) onto the skin daily. (May buy from over the counter): For smoking cessation  Indication: Nicotine Addiction   traZODone 50 MG tablet Commonly known as: DESYREL Take 1 tablet (50 mg total) by mouth at bedtime as needed for sleep.  Indication: Trouble Sleeping       Follow-up Information    [facility]. Go on 09/21/2019.   Specialty: Urgent Care Why: You have an appointment for  therapy on 09/21/19 at 9:00 am.  You also have a walk in appointment for medication management on 10/20/19 at 8:00 am with [name], NP.  These appointments will be held in person.  Contact information: [address] [place] [address] [phone]       [facility], [facility] Follow  up.   Contact information: [address] [place] [address] [phone]               Follow-up recommendations:  Activity:  ad lib  Comments: Patient being discharged to the [facility] residential substance abuse treatment program.  Signed: Antonieta Pert, MD 09/20/2019, 12:44 PM

## 2019-09-20 NOTE — Progress Notes (Signed)
  [facility] Adult Case Management Discharge Plan :  Will you be returning to the same living situation after discharge:  No. [facility] then return to [facility] At discharge, do you have transportation home?: No. [facility] Do you have the ability to pay for your medications: No. [facility] and samples  Release of information consent forms completed and in the chart;  Patient's signature needed at discharge.  Patient to Follow up at:  Follow-up Information    [facility]. Go on 09/21/2019.   Specialty: Urgent Care Why: You have an appointment for therapy on 09/21/19 at 9:00 am.  You also have a walk in appointment for medication management on 10/20/19 at 8:00 am with [name], NP.  These appointments will be held in person.  Contact information: [address] [place] [address] [phone]       [facility], [facility] Follow up.   Contact information: [address] [place] [address] [phone]               Next level of care provider has access to [facility] Link:yes  Safety Planning and Suicide Prevention discussed: Yes,  Brooke Bonito and Hessie Dibble from [facility]     Has patient been referred to the Quitline?: Patient refused referral  Patient has been referred for addiction treatment: Yes  Erin Sons, LCSW 09/20/2019, 8:19 AM

## 2019-09-21 ENCOUNTER — Ambulatory Visit (HOSPITAL_COMMUNITY): Payer: No Payment, Other | Admitting: Licensed Clinical Social Worker

## 2019-11-30 ENCOUNTER — Other Ambulatory Visit: Payer: Self-pay

## 2019-11-30 ENCOUNTER — Telehealth (INDEPENDENT_AMBULATORY_CARE_PROVIDER_SITE_OTHER): Payer: No Payment, Other | Admitting: Psychiatry

## 2019-11-30 ENCOUNTER — Encounter (HOSPITAL_COMMUNITY): Payer: Self-pay | Admitting: Psychiatry

## 2019-11-30 DIAGNOSIS — F332 Major depressive disorder, recurrent severe without psychotic features: Secondary | ICD-10-CM

## 2019-11-30 DIAGNOSIS — F1011 Alcohol abuse, in remission: Secondary | ICD-10-CM

## 2019-11-30 MED ORDER — GABAPENTIN 400 MG PO CAPS: 400.0000 mg | ORAL_CAPSULE | Freq: Three times a day (TID) | ORAL | 2 refills | Status: DC

## 2019-11-30 MED ORDER — CITALOPRAM HYDROBROMIDE 40 MG PO TABS: 40.0000 mg | ORAL_TABLET | Freq: Every day | ORAL | 2 refills | Status: DC

## 2019-11-30 NOTE — Progress Notes (Signed)
BH MD/PA/NP OP Progress Note Virtual Visit via Telephone Note  I connected with Tina Torres on 11/30/19 at  9:30 AM EST by telephone and verified that I am speaking with the correct person using two identifiers.  Location: Patient: home Provider: Clinic   I discussed the limitations, risks, security and privacy concerns of performing an evaluation and management service by telephone and the availability of in person appointments. I also discussed with the patient that there may be a patient responsible charge related to this service. The patient expressed understanding and agreed to proceed.   I provided 30 minutes of non-face-to-face time during this encounter.   11/30/2019 10:00 AM Tina Torres  MRN:  [id number]  Chief Complaint: "I've been having panic attacks at work"  HPI: 49 year old female seen today for follow up psychiatric evaluation.  She has a psychiatric history of depression, alcohol use, cocaine use, and suicidal ideations disorder.  Recently she was hospitalized at [facility] on 10/06/2019 presenting with increased depression and a desire to detox from alcohol and cocaine. She is currently being managed on Celexa 40 mg daily, gabapentin 400 mg 3 times daily, and Topamax 50 mg BID. She informed provider that she has only been taking Celexa and gabapentin. She informed provider that Topamax made her feel overly sedated.    After discharge form [facility] she was sent to [facility]" a substance abuse program to try and help maintain her sobriety.   Today patient's visit was done over the phone because patient was unable to login virtually. Today she was pleasant, cooperative and engaged in conversation. She describes her mood as improved since starting treatment and taking her medications.  She denied symptoms of depression and anxiety and both PHQ-9 and GAD-7 scores were 0.  She denies any fluctuation in moods, AVH, SI, or HI.  Patient  reports she is unable to see family for thanksgiving but will be spending it with her sponsor. Patient states she knows she is an addict and sought out treatment to get better.  Has maintained her sobriety since September.  No medication changes made today.  Patient agreeable to continue gabapentin 400 mg twice daily manage mood.  She is also agreeable to continue Celexa.  Follow-up with provider in 3 months.  No other concerns noted at this time.    Visit Diagnosis:    ICD-10-CM   1. Alcohol use disorder, mild, in early remission  F10.11 gabapentin (NEURONTIN) 400 MG capsule  2. Severe recurrent major depression without psychotic features (HCC)  F33.2 citalopram (CELEXA) 40 MG tablet    Past Psychiatric History: Depression, SI, cocaine use disorder Alcohol use disorder  Past Medical History:  Past Medical History:  Diagnosis Date  . Depression   . Diverticulitis   . Seizures (HCC)     Past Surgical History:  Procedure Laterality Date  . COLOSTOMY REVERSAL      Family Psychiatric History: Sister Bipolar, Anxiety, and ADHD  Family History: No family history on file.  Social History:  Social History   Socioeconomic History  . Marital status: Single    Spouse name: Not on file  . Number of children: Not on file  . Years of education: Not on file  . Highest education level: Not on file  Occupational History  . Not on file  Tobacco Use  . Smoking status: Current Every Day Smoker    Packs/day: 1.00    Years: 0.00    Pack years: 0.00  Types: Cigarettes  . Smokeless tobacco: Never Used  Vaping Use  . Vaping Use: Never used  Substance and Sexual Activity  . Alcohol use: Yes    Comment: last drink was 2/5th of liquor, been drinking "1 pint or more every day for a couple of weeks now"   . 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 Sexual activity: Not Currently  Other Topics Concern  . Not on file  Social  History Narrative  . Not on file   Social Determinants of Health   Financial Resource Strain:   . Difficulty of Paying Living Expenses: Not on file  Food Insecurity:   . Worried About Programme researcher, broadcasting/film/video in the Last Year: Not on file  . Ran Out of Food in the Last Year: Not on file  Transportation Needs:   . Lack of Transportation (Medical): Not on file  . Lack of Transportation (Non-Medical): Not on file  Physical Activity:   . Days of Exercise per Week: Not on file  . Minutes of Exercise per Session: Not on file  Stress:   . Feeling of Stress : Not on file  Social Connections:   . Frequency of Communication with Friends and Family: Not on file  . Frequency of Social Gatherings with Friends and Family: Not on file  . Attends Religious Services: Not on file  . Active Member of Clubs or Organizations: Not on file  . Attends Banker Meetings: Not on file  . Marital Status: Not on file    Allergies: No Known Allergies  Metabolic Disorder Labs: Lab Results  Component Value Date   HGBA1C 5.3 06/12/2019   MPG 105.41 06/12/2019   No results found for: PROLACTIN Lab Results  Component Value Date   CHOL 126 06/12/2019   TRIG 219 (H) 06/12/2019   HDL 46 06/12/2019   CHOLHDL 2.7 06/12/2019   VLDL 44 (H) 06/12/2019   LDLCALC 36 06/12/2019   Lab Results  Component Value Date   TSH 1.792 06/12/2019    Therapeutic Level Labs: No results found for: LITHIUM No results found for: VALPROATE No components found for:  CBMZ  Current Medications: Current Outpatient Medications  Medication Sig Dispense Refill  . citalopram (CELEXA) 40 MG tablet Take 1 tablet (40 mg total) by mouth daily. For depression 30 tablet 2  . gabapentin (NEURONTIN) 400 MG capsule Take 1 capsule (400 mg total) by mouth 3 (three) times daily. 90 capsule 2   No current facility-administered medications for this visit.     Musculoskeletal: Strength & Muscle Tone: Unable to assess due  telephone visit Gait & Station: Unable to assess due telephone visit Patient leans: N/A  Psychiatric Specialty Exam: Review of Systems  There were no vitals taken for this visit.There is no height or weight on file to calculate BMI.  General Appearance: Unable to assess due telephone visit  Eye Contact:  Good  Speech:  Clear and Coherent and Normal Rate  Volume:  Normal  Mood:  Euthymic  Affect:  Congruent  Thought Process:  Coherent, Goal Directed and Linear  Orientation:  Full (Time, Place, and Person)  Thought Content: WDL and Logical   Suicidal Thoughts:  No  Homicidal Thoughts:  No  Memory:  Immediate;   Good Recent;   Good Remote;   Good  Judgement:  Good  Insight:  Good  Psychomotor Activity:  Normal  Concentration:  Concentration:  Good and Attention Span: Good  Recall:  Good  Fund of Knowledge: Good  Language: Good  Akathisia:  No  Handed:  Right  AIMS (if indicated): Not done  Assets:  Communication Skills Desire for Improvement Financial Resources/Insurance Housing Social Support  ADL's:  Intact  Cognition: WNL  Sleep:  Good   Screenings: AIMS     [facility] Ad[facility] (Discharged[facility]  AIMS Total Score 0 0    AUDIT     [facility]  Alcohol Use Disorder Identification Test Final Score (AUDIT) 22    GAD-7     Video Visit from 11/30/2019 in [facility]  Total GAD-7 Score 0    PHQ2-9     Video Visit from 11/30/2019 in [facility]  PHQ-2 Total Score 0  PHQ-9 Total Score 0       Assessment and Plan: Patient informed provider that she is doing well on her current medication regimen.  No medication changes made today.  Patient will continue all medications as prescribed.  1. Severe  recurrent major depression without psychotic features (HCC)  Continue- citalopram (CELEXA) 40 MG tablet; Take 1 tablet (40 mg total) by mouth daily. For depression  Dispense: 30 tablet; Refill: 2  2. Alcohol use disorder, mild, in early remission  Continue- gabapentin (NEURONTIN) 400 MG capsule; Take 1 capsule (400 mg total) by mouth 3 (three) times daily.  Dispense: 90 capsule; Refill: 2  Follow-up in 3 months   Shanna Cisco, NP 11/30/2019, 10:00 AM

## 2020-02-21 ENCOUNTER — Other Ambulatory Visit: Payer: Self-pay

## 2020-02-21 ENCOUNTER — Ambulatory Visit (HOSPITAL_COMMUNITY)
Admission: EM | Admit: 2020-02-21 | Discharge: 2020-02-22 | Disposition: A | Discharge status: Home / Self Care | Payer: No Payment, Other | Attending: Psychiatry | Admitting: Psychiatry

## 2020-02-21 DIAGNOSIS — R45851 Suicidal ideations: Secondary | ICD-10-CM

## 2020-02-21 DIAGNOSIS — F1010 Alcohol abuse, uncomplicated: Secondary | ICD-10-CM | POA: Diagnosis not present

## 2020-02-21 DIAGNOSIS — Z20822 Contact with and (suspected) exposure to covid-19: Secondary | ICD-10-CM | POA: Diagnosis not present

## 2020-02-21 DIAGNOSIS — F332 Major depressive disorder, recurrent severe without psychotic features: Secondary | ICD-10-CM

## 2020-02-21 DIAGNOSIS — F1011 Alcohol abuse, in remission: Secondary | ICD-10-CM

## 2020-02-21 LAB — POC SARS CORONAVIRUS 2 AG -  ED: SARS Coronavirus 2 Ag: NEGATIVE

## 2020-02-21 LAB — POC SARS CORONAVIRUS 2 AG: SARS Coronavirus 2 Ag: NEGATIVE

## 2020-02-21 MED ORDER — HYDROXYZINE HCL 25 MG PO TABS
25.0000 mg | ORAL_TABLET | Freq: Four times a day (QID) | ORAL | Status: DC | PRN
  Administered 2020-02-21: 25 mg via ORAL
  Filled 2020-02-21: qty 1

## 2020-02-21 MED ORDER — MAGNESIUM HYDROXIDE 400 MG/5ML PO SUSP: 30.0000 mL | Freq: Every day | ORAL | Status: DC | PRN

## 2020-02-21 MED ORDER — ALUM & MAG HYDROXIDE-SIMETH 200-200-20 MG/5ML PO SUSP: 30.0000 mL | ORAL | Status: DC | PRN

## 2020-02-21 MED ORDER — THIAMINE HCL 100 MG PO TABS
100.0000 mg | ORAL_TABLET | Freq: Every day | ORAL | Status: DC
Start: 2020-02-22 — End: 2020-02-22
  Administered 2020-02-22: 100 mg via ORAL
  Filled 2020-02-21: qty 1

## 2020-02-21 MED ORDER — GABAPENTIN 400 MG PO CAPS
400.0000 mg | ORAL_CAPSULE | Freq: Three times a day (TID) | ORAL | Status: DC
  Administered 2020-02-21 – 2020-02-22 (×2): 400 mg via ORAL
  Filled 2020-02-21: qty 21
  Filled 2020-02-21 (×2): qty 1

## 2020-02-21 MED ORDER — THIAMINE HCL 100 MG/ML IJ SOLN
100.0000 mg | Freq: Once | INTRAMUSCULAR | Status: AC
  Administered 2020-02-21: 100 mg via INTRAMUSCULAR
  Filled 2020-02-21: qty 2

## 2020-02-21 MED ORDER — ONDANSETRON 4 MG PO TBDP: 4.0000 mg | ORAL_TABLET | Freq: Four times a day (QID) | ORAL | Status: DC | PRN

## 2020-02-21 MED ORDER — ADULT MULTIVITAMIN W/MINERALS CH
1.0000 | ORAL_TABLET | Freq: Every day | ORAL | Status: DC
  Administered 2020-02-22: 1 via ORAL
  Filled 2020-02-21: qty 1

## 2020-02-21 MED ORDER — LORAZEPAM 1 MG PO TABS: 1.0000 mg | ORAL_TABLET | Freq: Four times a day (QID) | ORAL | Status: DC | PRN

## 2020-02-21 MED ORDER — TRAZODONE HCL 50 MG PO TABS
50.0000 mg | ORAL_TABLET | Freq: Every evening | ORAL | Status: DC | PRN
  Administered 2020-02-21: 50 mg via ORAL
  Filled 2020-02-21: qty 1

## 2020-02-21 MED ORDER — LOPERAMIDE HCL 2 MG PO CAPS: 2.0000 mg | ORAL_CAPSULE | ORAL | Status: DC | PRN

## 2020-02-21 MED ORDER — ACETAMINOPHEN 325 MG PO TABS: 650.0000 mg | ORAL_TABLET | Freq: Four times a day (QID) | ORAL | Status: DC | PRN

## 2020-02-21 MED ORDER — CITALOPRAM HYDROBROMIDE 40 MG PO TABS
40.0000 mg | ORAL_TABLET | Freq: Every day | ORAL | Status: DC
  Administered 2020-02-22: 40 mg via ORAL
  Filled 2020-02-21: qty 7
  Filled 2020-02-21: qty 2

## 2020-02-21 NOTE — ED Notes (Signed)
[facility] called for STAT labs

## 2020-02-21 NOTE — ED Notes (Addendum)
Pt admitted to continuous assessment due to SI, ETOH use, and worsening depression. Pt tolerated skin assessment and lab work well. Pt A&O x4, calm and cooperative. Pt ambulated independently to unit. Oriented to unit/staff. No signs of acute distress noted. Will continue to monitor for safety.
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
## 2020-02-21 NOTE — ED Provider Notes (Incomplete)
Behavioral Health Admission H&P [facility](FBC & OBS)  Date: 02/21/20 Patient Name: Tina Torres MRN: [id number] Chief Complaint: No chief complaint on file.     Diagnoses:  Final diagnoses:  Severe episode of recurrent major depressive disorder, without psychotic features (HCC)  Suicidal ideation  Alcohol abuse    HPI: Tina Torres is a 50y/o female. Patient [facility] with chief complaint of "worsening depression, suicidal thoughts, and I want alcohol detox." Patient reports that she was unable to afford and medications, she reports that she last took her anti-depressants in December of 2021. She reports that she has become more depressed because "I quit taking my meds." She also admitted that she relasped on alcohol after 90 days of sobriety. She reports that she drinks approximately "1 pint to 1/5 of vodka per day". She reports that her last drink was earlier today 02/21/20.     PHQ 2-9:  Flowsheet Row [facility]  Thoughts that you would be better off dead, or of hurting yourself in some way Not at all  PHQ-9 Total Score 0      Flowsheet Row [facility]) [facility]15/2019 in [facility] Admission (Discharged) [facility]  C-SSRS RISK CATEGORY High Risk High Risk Moderate Risk       Total Time spent with patient: {Time; 15 min - 8 hours:17441}  Musculoskeletal  Strength & Muscle Tone: {desc; muscle tone:32375} Gait & Station: {PE GAIT ED HQIO:96295}ATL:22525} Patient leans: {Patient Leans:21022755}  Psychiatric Specialty Exam  Presentation General Appearance: Appropriate for Environment; Fairly Groomed  Eye Contact:Good  Speech:Clear and Coherent  Speech Volume:Normal  Handedness:Right   Mood and Affect   Mood:Depressed  Affect:Depressed   Thought Process  Thought Processes:Coherent  Descriptions of Associations:Intact  Orientation:Full (Time, Place and Person)  Thought Content:WDL  Hallucinations:Hallucinations: None  Ideas of Reference:None  Suicidal Thoughts:Suicidal Thoughts: Yes, Passive SI Passive Intent and/or Plan: Without Plan; With Access to Means  Homicidal Thoughts:Homicidal Thoughts: No   Sensorium  Memory:Immediate Good; Remote Good; Recent Good  Judgment:Good  Insight:Good   Executive Functions  Concentration:Good  Attention Span:Good  Recall:Good  Fund of Knowledge:Good  Language:Good   Psychomotor Activity  Psychomotor Activity:Psychomotor Activity: Normal   Assets  Assets:Communication Skills; Housing; Social Support   Sleep  Sleep:Sleep: Good   Physical Exam Vitals and nursing note reviewed.  Constitutional:      General: She is not in acute distress.    Appearance: Normal appearance.  HENT:     Head: Normocephalic and atraumatic.     Nose: No rhinorrhea.  Eyes:     General:        Right eye: No discharge.        Left eye: No discharge.  Cardiovascular:     Rate and Rhythm: Normal rate.  Pulmonary:     Effort: No respiratory distress.  Musculoskeletal:     Cervical back: No rigidity.  Skin:    Coloration: Skin is not jaundiced or pale.  Neurological:     Mental Status: She is alert and oriented to person, place, and time.  Psychiatric:        Attention and Perception: Attention normal. She does not perceive auditory or visual hallucinations.        Mood and Affect: Mood is depressed. Mood is not anxious. Affect is not angry.  Speech: Speech normal.        Behavior: Behavior normal. Behavior is cooperative.        Thought Content: Thought content is not paranoid or delusional. Thought content includes suicidal (passive) ideation. Thought content does not include homicidal ideation. Thought content does not  include homicidal or suicidal plan.        Cognition and Memory: Cognition is not impaired. Memory is not impaired. She does not exhibit impaired recent memory or impaired remote memory.        Judgment: Judgment normal.    Review of Systems  Constitutional: Negative for chills, fever, malaise/fatigue and weight loss.  Respiratory: Negative for cough, hemoptysis, sputum production and shortness of breath.   Cardiovascular: Negative for chest pain and palpitations.  Gastrointestinal: Negative for heartburn, nausea and vomiting.  Musculoskeletal: Negative for falls.  Skin: Negative.   Psychiatric/Behavioral: Positive for depression. Negative for hallucinations and memory loss. Suicidal ideas: paasive SI. The patient is not nervous/anxious and does not have insomnia.     Pulse 82, temperature 97.8 F (36.6 C), temperature source Tympanic, resp. rate 18, SpO2 97 %. There is no height or weight on file to calculate BMI.  Past Psychiatric History: ***   Is the patient at risk to self? {YES/NO:21197} Has the patient been a risk to self in the past 6 months? {YES/NO:21197}.    Has the patient been a risk to self within the distant past? {YES/NO:21197}  Is the patient a risk to others? {YES/NO:21197}  Has the patient been a risk to others in the past 6 months? {YES/NO:21197}  Has the patient been a risk to others within the distant past? {YES/NO:21197}  Past Medical History:  Past Medical History:  Diagnosis Date  . Depression   . Diverticulitis   . Seizures (HCC)     Past Surgical History:  Procedure Laterality Date  . COLOSTOMY REVERSAL      Family History: No family history on file.  Social History:  Social History   Socioeconomic History  . Marital status: Single    Spouse name: Not on file  . Number of children: Not on file  . Years of education: Not on file  . Highest education level: Not on file  Occupational History  . Not on file  Tobacco Use  . Smoking status:  Current Every Day Smoker    Packs/day: 1.00    Years: 0.00    Pack years: 0.00    Types: Cigarettes  . Smokeless tobacco: Never Used  Vaping Use  . Vaping Use: Never used  Substance and Sexual Activity  . Alcohol use: Yes    Comment: last drink was 2/5th of liquor, been drinking "1 pint or more every day for a couple of weeks now"   . 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 Sexual activity: Not Currently  Other Topics Concern  . Not on file  Social History Narrative  . Not on file   Social Determinants of Health   Financial Resource Strain: Not on file  Food Insecurity: Not on file  Transportation Needs: Not on file  Physical Activity: Not on file  Stress: Not on file  Social Connections: Not on file  Intimate Partner Violence: Not on file    SDOH:  SDOH Screenings   Alcohol Screen: Medium Risk  . Last Alcohol Screening Score (AUDIT): 22  Depression (PHQ2-9): Low Risk   . PHQ-2  Score: 0  Financial Resource Strain: Not on file  Food Insecurity: Not on file  Housing: Not on file  Physical Activity: Not on file  Social Connections: Not on file  Stress: Not on file  Tobacco Use: High Risk  . Smoking Tobacco Use: Current Every Day Smoker  . Smokeless Tobacco Use: Never Used  Transportation Needs: Not on file    Last Labs:  Admission on 09/15/2019, Discharged on 09/16/2019  Component Date Value Ref Range Status  . Sodium 09/15/2019 143  135 - 145 mmol/L Final  . Potassium 09/15/2019 4.1  3.5 - 5.1 mmol/L Final  . Chloride 09/15/2019 105  98 - 111 mmol/L Final  . CO2 09/15/2019 24  22 - 32 mmol/L Final  . Glucose, Bld 09/15/2019 83  70 - 99 mg/dL Final   Glucose reference range applies only to samples taken after fasting for at least 8 hours.  . BUN 09/15/2019 15  6 - 20 mg/dL Final  . Creatinine, Ser 09/15/2019 0.89  0.44 - 1.00 mg/dL Final  . Calcium 15/72/6203 8.9  8.9 - 10.3 mg/dL Final  . Total  Protein 09/15/2019 7.0  6.5 - 8.1 g/dL Final  . Albumin 55/97/4163 3.8  3.5 - 5.0 g/dL Final  . AST 84/53/6468 20  15 - 41 U/L Final  . ALT 09/15/2019 13  0 - 44 U/L Final  . Alkaline Phosphatase 09/15/2019 109  38 - 126 U/L Final  . Total Bilirubin 09/15/2019 0.5  0.3 - 1.2 mg/dL Final  . GFR calc non Af Amer 09/15/2019 >60  >60 mL/min Final  . GFR calc Af Amer 09/15/2019 >60  >60 mL/min Final  . Anion gap 09/15/2019 14  5 - 15 Final   Performed at [facility], [address], [place], [place] [address]  . Alcohol, Ethyl (B) 09/15/2019 192* <10 mg/dL Final   Comment: (NOTE) Lowest detectable limit for serum alcohol is 10 mg/dL.  For medical purposes only. Performed at [facility], [address], [place], [place] [address]   . Salicylate Lvl 09/15/2019 <7.0* 7.0 - 30.0 mg/dL Final   Performed at [facility], [address] [place], [place] [address]  . Acetaminophen (Tylenol), Serum 09/15/2019 <10* 10 - 30 ug/mL Final   Comment: (NOTE) Therapeutic concentrations vary significantly. A range of 10-30 ug/mL  may be an effective concentration for many patients. However, some  are best treated at concentrations outside of this range. Acetaminophen concentrations >150 ug/mL at 4 hours after ingestion  and >50 ug/mL at 12 hours after ingestion are often associated with  toxic reactions.  Performed at [facility], [address], [place], [place] [address]   . WBC 09/15/2019 14.2* 4.0 - 10.5 K/uL Final  . RBC 09/15/2019 4.26  3.87 - 5.11 MIL/uL Final  . Hemoglobin 09/15/2019 13.6  12.0 - 15.0 g/dL Final  . HCT 16/94/5038 41.4  36.0 - 46.0 % Final  . MCV 09/15/2019 97.2  80.0 - 100.0 fL Final  . MCH 09/15/2019 31.9  26.0 - 34.0 pg Final  . MCHC 09/15/2019 32.9  30.0 - 36.0 g/dL Final  . RDW 88/28/0034 14.6  11.5 - 15.5 % Final  . Platelets 09/15/2019 363  150 - 400 K/uL Final  . nRBC 09/15/2019 0.0  0.0  - 0.2 % Final   Performed at [facility], [address], [place], [place] [address]  . Opiates 09/15/2019 NONE DETECTED  NONE DETECTED Final  .  Cocaine 09/15/2019 NONE DETECTED  NONE DETECTED Final  . Benzodiazepines 09/15/2019 NONE DETECTED  NONE DETECTED Final  . Amphetamines 09/15/2019 NONE DETECTED  NONE DETECTED Final  . Tetrahydrocannabinol 09/15/2019 NONE DETECTED  NONE DETECTED Final  . Barbiturates 09/15/2019 NONE DETECTED  NONE DETECTED Final   Comment: (NOTE) DRUG SCREEN FOR MEDICAL PURPOSES ONLY.  IF CONFIRMATION IS NEEDED FOR ANY PURPOSE, NOTIFY LAB WITHIN 5 DAYS.  LOWEST DETECTABLE LIMITS FOR URINE DRUG SCREEN Drug Class                     Cutoff (ng/mL) Amphetamine and metabolites    1000 Barbiturate and metabolites    200 Benzodiazepine                 200 Tricyclics and metabolites     300 Opiates and metabolites        300 Cocaine and metabolites        300 THC                            50 Performed at [facility], [address], [place], [place] [address]   . I-stat hCG, quantitative 09/15/2019 6.3* <5 mIU/mL Final  . Comment 3 09/15/2019          Final   Comment:   GEST. AGE      CONC.  (mIU/mL)   <=1 WEEK        5 - 50     2 WEEKS       50 - 500     3 WEEKS       100 - 10,000     4 WEEKS     1,000 - 30,000        FEMALE AND NON-PREGNANT FEMALE:     LESS THAN 5 mIU/mL   . SARS Coronavirus 2 09/15/2019 NEGATIVE  NEGATIVE Final   Comment: (NOTE) SARS-CoV-2 target nucleic acids are NOT DETECTED.  The SARS-CoV-2 RNA is generally detectable in upper and lower respiratory specimens during the acute phase of infection. The lowest concentration of SARS-CoV-2 viral copies this assay can detect is 250 copies / mL. A negative result does not preclude SARS-CoV-2 infection and should not be used as the sole basis for treatment or other patient management decisions.  A negative result may occur with improper specimen  collection / handling, submission of specimen other than nasopharyngeal swab, presence of viral mutation(s) within the areas targeted by this assay, and inadequate number of viral copies (<250 copies / mL). A negative result must be combined with clinical observations, patient history, and epidemiological information.  Fact Sheet for Patients:   BoilerBrush.com.cy  Fact Sheet for Healthcare Providers: https://pope.com/  This test is not yet approved or                           cleared by the [place] FDA and has been authorized for detection and/or diagnosis of SARS-CoV-2 by FDA under an Emergency Use Authorization (EUA).  This EUA will remain in effect (meaning this test can be used) for the duration of the COVID-19 declaration under Section 564(b)(1) of the Act, 21 U.S.C. section 360bbb-3(b)(1), unless the authorization is terminated or revoked sooner.  Performed at [facility], [address] [place], [place] [address]   Admission on 08/31/2019, Discharged on 09/01/2019  Component Date Value Ref Range Status  .  Sodium 08/31/2019 140  135 - 145 mmol/L Final  . Potassium 08/31/2019 3.5  3.5 - 5.1 mmol/L Final  . Chloride 08/31/2019 106  98 - 111 mmol/L Final  . CO2 08/31/2019 21* 22 - 32 mmol/L Final  . Glucose, Bld 08/31/2019 83  70 - 99 mg/dL Final   Glucose reference range applies only to samples taken after fasting for at least 8 hours.  . BUN 08/31/2019 22* 6 - 20 mg/dL Final  . Creatinine, Ser 08/31/2019 0.98  0.44 - 1.00 mg/dL Final  . Calcium 02/63/7858 8.8* 8.9 - 10.3 mg/dL Final  . Total Protein 08/31/2019 6.9  6.5 - 8.1 g/dL Final  . Albumin 85/02/7739 3.7  3.5 - 5.0 g/dL Final  . AST 28/78/6767 30  15 - 41 U/L Final  . ALT 08/31/2019 18  0 - 44 U/L Final  . Alkaline Phosphatase 08/31/2019 66  38 - 126 U/L Final  . Total Bilirubin 08/31/2019 0.5  0.3 - 1.2 mg/dL Final  . GFR calc non Af Amer  08/31/2019 >60  >60 mL/min Final  . GFR calc Af Amer 08/31/2019 >60  >60 mL/min Final  . Anion gap 08/31/2019 13  5 - 15 Final   Performed at [facility], [address], [place], [place] [address]  . Sodium 08/31/2019 140  135 - 145 mmol/L Final  . Potassium 08/31/2019 3.6  3.5 - 5.1 mmol/L Final  . Chloride 08/31/2019 107  98 - 111 mmol/L Final  . BUN 08/31/2019 23* 6 - 20 mg/dL Final  . Creatinine, Ser 08/31/2019 1.00  0.44 - 1.00 mg/dL Final  . Glucose, Bld 09/62/8366 82  70 - 99 mg/dL Final   Glucose reference range applies only to samples taken after fasting for at least 8 hours.  . Calcium, Ion 08/31/2019 1.15  1.15 - 1.40 mmol/L Final  . TCO2 08/31/2019 20* 22 - 32 mmol/L Final  . Hemoglobin 08/31/2019 13.3  12.0 - 15.0 g/dL Final  . HCT 29/47/6546 39.0  36.0 - 46.0 % Final  . WBC 08/31/2019 10.1  4.0 - 10.5 K/uL Final  . RBC 08/31/2019 4.16  3.87 - 5.11 MIL/uL Final  . Hemoglobin 08/31/2019 13.2  12.0 - 15.0 g/dL Final  . HCT 50/35/4656 40.6  36.0 - 46.0 % Final  . MCV 08/31/2019 97.6  80.0 - 100.0 fL Final  . MCH 08/31/2019 31.7  26.0 - 34.0 pg Final  . MCHC 08/31/2019 32.5  30.0 - 36.0 g/dL Final  . RDW 81/27/5170 14.6  11.5 - 15.5 % Final  . Platelets 08/31/2019 261  150 - 400 K/uL Final  . nRBC 08/31/2019 0.0  0.0 - 0.2 % Final   Performed at [facility], [address], [place], [place] [address]  . Alcohol, Ethyl (B) 08/31/2019 27* <10 mg/dL Final   Comment: (NOTE) Lowest detectable limit for serum alcohol is 10 mg/dL.  For medical purposes only. Performed at [facility], [address], [place], [place] [address]   . Prothrombin Time 08/31/2019 13.4  11.4 - 15.2 seconds Final  . INR 08/31/2019 1.1  0.8 - 1.2 Final   Comment: (NOTE) INR goal varies based on device and disease states. Performed at [facility], [address] [place], [place] [address]   . Blood Bank Specimen 08/31/2019 SAMPLE AVAILABLE FOR TESTING   Final  . Sample  Expiration 08/31/2019    Final  Value:09/01/2019,2359 Performed at [facility], [address], [place], [place] [address]     Allergies: Patient has no known allergies.  PTA Medications: (Not in a hospital admission)   Medical Decision Making  ***    Recommendations  [facility] MSE Recommendations:[id number]}  Maricela Bo, NP 02/21/20  10:18 PM

## 2020-02-22 LAB — COMPREHENSIVE METABOLIC PANEL
ALT: 30 U/L (ref 0–44)
AST: 25 U/L (ref 15–41)
Albumin: 4 g/dL (ref 3.5–5.0)
Alkaline Phosphatase: 73 U/L (ref 38–126)
Anion gap: 13 (ref 5–15)
BUN: 20 mg/dL (ref 6–20)
CO2: 21 mmol/L — ABNORMAL LOW (ref 22–32)
Calcium: 9.8 mg/dL (ref 8.9–10.3)
Chloride: 104 mmol/L (ref 98–111)
Creatinine, Ser: 0.84 mg/dL (ref 0.44–1.00)
GFR, Estimated: >60 mL/min (ref >60)
Glucose, Bld: 75 mg/dL (ref 70–99)
Potassium: 4.8 mmol/L (ref 3.5–5.1)
Sodium: 138 mmol/L (ref 135–145)
Total Bilirubin: 0.5 mg/dL (ref 0.3–1.2)
Total Protein: 7 g/dL (ref 6.5–8.1)

## 2020-02-22 LAB — CBC WITH DIFFERENTIAL/PLATELET
Abs Immature Granulocytes: 0.05 10*3/uL (ref 0.00–0.07)
Basophils Absolute: 0.1 10*3/uL (ref 0.0–0.1)
Basophils Relative: 1 %
Eosinophils Absolute: 0.2 10*3/uL (ref 0.0–0.5)
Eosinophils Relative: 2 %
HCT: 44.9 % (ref 36.0–46.0)
Hemoglobin: 14.7 g/dL (ref 12.0–15.0)
Immature Granulocytes: 1 %
Lymphocytes Relative: 36 %
Lymphs Abs: 3.5 10*3/uL (ref 0.7–4.0)
MCH: 30.3 pg (ref 26.0–34.0)
MCHC: 32.7 g/dL (ref 30.0–36.0)
MCV: 92.6 fL (ref 80.0–100.0)
Monocytes Absolute: 0.4 10*3/uL (ref 0.1–1.0)
Monocytes Relative: 4 %
Neutro Abs: 5.3 10*3/uL (ref 1.7–7.7)
Neutrophils Relative %: 56 %
Platelets: 243 10*3/uL (ref 150–400)
RBC: 4.85 MIL/uL (ref 3.87–5.11)
RDW: 12.9 % (ref 11.5–15.5)
WBC: 9.5 10*3/uL (ref 4.0–10.5)
nRBC: 0 % (ref 0.0–0.2)

## 2020-02-22 LAB — RESP PANEL BY RT-PCR (FLU A&B, COVID) ARPGX2
Influenza A by PCR: NEGATIVE
Influenza B by PCR: NEGATIVE
SARS Coronavirus 2 by RT PCR: NEGATIVE

## 2020-02-22 LAB — TSH: TSH: 2.891 u[IU]/mL (ref 0.350–4.500)

## 2020-02-22 LAB — ETHANOL: Alcohol, Ethyl (B): 12 mg/dL — ABNORMAL HIGH (ref <10)

## 2020-02-22 MED ORDER — GABAPENTIN 400 MG PO CAPS: 400.0000 mg | ORAL_CAPSULE | Freq: Three times a day (TID) | ORAL | 0 refills | Status: DC

## 2020-02-22 MED ORDER — CITALOPRAM HYDROBROMIDE 40 MG PO TABS: 40.0000 mg | ORAL_TABLET | Freq: Every day | ORAL | 0 refills | Status: DC
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
## 2020-02-22 NOTE — Progress Notes (Signed)
Pt is sleeping. Resps are even and unlabored. No acute noted at this time. Staff will monitor for safety.

## 2020-02-22 NOTE — ED Notes (Signed)
Patient A&O x 4, ambulatory. Patient discharged in no acute distress. Patient denied SI/HI, A/VH upon discharge. Patient verbalized understanding of all discharge instructions explained by staff, to include follow up appointments, RX's and safety plan. Pt belongings returned to patient from locker intact to include a brown notebook. Patient escorted to back sallyport via staff for transport to destination. Safety maintained.

## 2020-02-22 NOTE — Discharge Instructions (Signed)
[place] [facility] at [address], [place], [place] [address] can provide medications on credit for up to 25 dollars; however, you will need to be able to pay for medications prior to next refill.

## 2020-02-22 NOTE — ED Notes (Signed)
Pt asleep in bed. Respirations even and unlabored. Will continue to monitor for safety. ?

## 2020-02-22 NOTE — Progress Notes (Signed)
Pt continues to sleep. Resps are even and unlabored. No acute distress noted. Pt denies pain, SI, HI and AVH at this time. Pt took meds with no incident. Pt's safety is maintained.

## 2020-02-23 LAB — HEMOGLOBIN A1C
Hgb A1c MFr Bld: 5.3 % (ref 4.8–5.6)
Mean Plasma Glucose: 105 mg/dL

## 2020-03-01 ENCOUNTER — Ambulatory Visit (INDEPENDENT_AMBULATORY_CARE_PROVIDER_SITE_OTHER): Payer: No Payment, Other | Admitting: Psychiatry

## 2020-03-01 ENCOUNTER — Other Ambulatory Visit: Payer: Self-pay

## 2020-03-01 ENCOUNTER — Telehealth (HOSPITAL_COMMUNITY): Payer: No Payment, Other | Admitting: General Practice

## 2020-03-01 ENCOUNTER — Encounter (HOSPITAL_COMMUNITY): Payer: Self-pay | Admitting: Psychiatry

## 2020-03-01 VITALS — BP 146/92 | HR 89 | Ht 63.0 in | Wt 170.0 lb

## 2020-03-01 DIAGNOSIS — F1011 Alcohol abuse, in remission: Secondary | ICD-10-CM | POA: Diagnosis not present

## 2020-03-01 DIAGNOSIS — F331 Major depressive disorder, recurrent, moderate: Secondary | ICD-10-CM

## 2020-03-01 MED ORDER — GABAPENTIN 400 MG PO CAPS
400.0000 mg | ORAL_CAPSULE | Freq: Three times a day (TID) | ORAL | 2 refills | Status: DC
Start: 2020-03-01 — End: 2020-05-28

## 2020-03-01 MED ORDER — CITALOPRAM HYDROBROMIDE 40 MG PO TABS: 40.0000 mg | ORAL_TABLET | Freq: Every day | ORAL | 2 refills | Status: DC

## 2020-03-01 NOTE — Progress Notes (Signed)
BH MD/PA/NP OP Progress Note   03/01/2020 10:10 AM Tina Torres  MRN:  [id number]  Chief Complaint: "Things have not been good"  Chief Complaint    Medication Management     HPI: 50 year old female seen today for follow up psychiatric evaluation.  She has a psychiatric history of depression, alcohol use, cocaine use, and suicidal ideations disorder. She is currently being managed on Celexa 40 mg daily and gabapentin 400 mg 3 times daily. She notes that she has not has her gabapentin in over a week. She notes that [facility] but notes she throw her pills away.  She was then given a 7-day prescription by another provider which is now ran out.  Patient pharmacy notes that they will not fill gabapentin until 03/05/2020 without providers consent.  Provider informed pharmacy that they can fill it.  Provider also informed pharmacist and patient that if medications are misplaced again that they will not be refilled.  She endorsed understanding and agreed.  Today patient is pleasant, cooperative, and engaged in conversation.  She informed Clinical research associate that she has been more depressed and anxious since not being on her medications.  She notes that she left caring services however reports that she completed the IOP program.  She notes that because she did not take her medication she was hospitalized.  Today provider conducted a GAD-7 and patient scored a 15, at her last visit she scored a 0.  Provider also conducted a PHQ-9 and patient scored a 22.  Today she denies SI/HI/VAH or paranoia.  She notes that her appetite fluctuates however notes that she is sleeping well.  Patient denied relapsing on alcohol however it is documented in her hospital today that she admitted to relapsing after 90 days of sobriety.  No medication changes made today.  Patient agreeable to restart her medication.  She will follow-up with provider in 3 months.  No other concerns noted at this  time.    Visit Diagnosis:    ICD-10-CM   1. Moderate episode of recurrent major depressive disorder (HCC)  F33.1 citalopram (CELEXA) 40 MG tablet  2. Alcohol use disorder, mild, in early remission  F10.11 gabapentin (NEURONTIN) 400 MG capsule    Past Psychiatric History: Depression, SI, cocaine use disorder Alcohol use disorder  Past Medical History:  Past Medical History:  Diagnosis Date  . Depression   . Diverticulitis   . Seizures (HCC)     Past Surgical History:  Procedure Laterality Date  . COLOSTOMY REVERSAL      Family Psychiatric History: Sister Bipolar, Anxiety, and ADHD  Family History: No family history on file.  Social History:  Social History   Socioeconomic History  . Marital status: Single    Spouse name: Not on file  . Number of children: Not on file  . Years of education: Not on file  . Highest education level: Not on file  Occupational History  . Not on file  Tobacco Use  . Smoking status: Current Every Day Smoker    Packs/day: 1.00    Years: 0.00    Pack years: 0.00    Types: Cigarettes  . Smokeless tobacco: Never Used  Vaping Use  . Vaping Use: Never used  Substance and Sexual Activity  . Alcohol use: Yes    Comment: last drink was 2/5th of liquor, been drinking "1 pint or more every day for a couple of weeks now"   . Drug use: Yes  Types: Other-see comments    Comment: "kratom," pt said it's synthetic heroin  . Sexual activity: Not Currently  Other Topics Concern  . Not on file  Social History Narrative  . Not on file   Social Determinants of Health   Financial Resource Strain: Not on file  Food Insecurity: Not on file  Transportation Needs: Not on file  Physical Activity: Not on file  Stress: Not on file  Social Connections: Not on file    Allergies: No Known Allergies  Metabolic Disorder Labs: Lab Results  Component Value Date   HGBA1C 5.3 02/21/2020   MPG 105 02/21/2020   MPG 105.41 06/12/2019   No results found  for: PROLACTIN Lab Results  Component Value Date   CHOL 126 06/12/2019   TRIG 219 (H) 06/12/2019   HDL 46 06/12/2019   CHOLHDL 2.7 06/12/2019   VLDL 44 (H) 06/12/2019   LDLCALC 36 06/12/2019   Lab Results  Component Value Date   TSH 2.891 02/21/2020   TSH 1.792 06/12/2019    Therapeutic Level Labs: No results found for: LITHIUM No results found for: VALPROATE No components found for:  CBMZ  Current Medications: Current Outpatient Medications  Medication Sig Dispense Refill  . citalopram (CELEXA) 40 MG tablet Take 1 tablet (40 mg total) by mouth daily. 30 tablet 2  . gabapentin (NEURONTIN) 400 MG capsule Take 1 capsule (400 mg total) by mouth 3 (three) times daily. 90 capsule 2   No current facility-administered medications for this visit.     Musculoskeletal: Strength & Muscle Tone: within normal limits Gait & Station: normal Patient leans: N/A  Psychiatric Specialty Exam: Review of Systems  Blood pressure (!) 146/92, pulse 89, height 5\' 3"  (1.6 m), weight 170 lb (77.1 kg), SpO2 97 %.Body mass index is 30.11 kg/m.  General Appearance: Well Groomed  Eye Contact:  Good  Speech:  Clear and Coherent and Normal Rate  Volume:  Normal  Mood:  Anxious and Depressed  Affect:  Congruent  Thought Process:  Coherent, Goal Directed and Linear  Orientation:  Full (Time, Place, and Person)  Thought Content: WDL and Logical   Suicidal Thoughts:  No  Homicidal Thoughts:  No  Memory:  Immediate;   Good Recent;   Good Remote;   Good  Judgement:  Good  Insight:  Good  Psychomotor Activity:  Normal  Concentration:  Concentration: Good and Attention Span: Good  Recall:  Good  Fund of Knowledge: Good  Language: Good  Akathisia:  No  Handed:  Right  AIMS (if indicated): Not done  Assets:  Communication Skills Desire for Improvement Financial Resources/Insurance Housing Social Support  ADL's:  Intact  Cognition: WNL  Sleep:  Good   Screenings: AIMS   Flowsheet Row  [facility]  AIMS Total Score 0 0    AUDIT   Flowsheet Row [facility]  Alcohol Use Disorder Identification Test Final Score (AUDIT) 22    GAD-7   Flowsheet Row Clinical Support from 03/01/2020 in [facility] Video Visit from 11/30/2019 in [facility]  Total GAD-7 Score 15 0    PHQ2-9   Flowsheet Row Clinical Support from 03/01/2020 in [facility] Video Visit from 11/30/2019 in [facility]  PHQ-2 Total Score 6 0  PHQ-9  Total Score 22 0    Flowsheet Row Clinical Support [facility] 03/01/2020 in [facility] ED from 09[facility]15/2019 in [facility]  C-SSRS RISK CATEGORY Error: Q7 should not be populated when Q6 is No High Risk High Risk       Assessment and Plan: Patient endorses symptoms of anxiety and depression since being off of her medication.  He is agreeable to restarting Celexa 40 mg daily and gabapentin 400 mg 3 times daily to help manage her psychiatric conditions.  1. Alcohol use disorder, mild, in early remission  Restart- gabapentin (NEURONTIN) 400 MG capsule; Take 1 capsule (400 mg total) by mouth 3 (three) times daily.  Dispense: 90 capsule; Refill: 2  2. Moderate episode of recurrent major depressive disorder (HCC)  Restart- citalopram (CELEXA) 40 MG tablet; Take 1 tablet (40 mg total) by mouth daily.  Dispense: 30 tablet; Refill: 2  Follow-up in 3 months   Shanna Cisco, NP 03/01/2020, 10:10 AM

## 2020-03-01 NOTE — BH Assessment (Signed)
Patient was referred for Care Management services fron Gretchen Short, NP.   Per [place] [employer] needs assistance with obtaining employment.  Writer left HIPPA compliant voice mail at [phone] will await a call back from the patient.

## 2020-05-23 ENCOUNTER — Encounter (HOSPITAL_COMMUNITY): Payer: No Payment, Other | Admitting: Psychiatry

## 2020-05-27 ENCOUNTER — Other Ambulatory Visit (HOSPITAL_COMMUNITY): Payer: Self-pay | Admitting: Psychiatry

## 2020-05-27 DIAGNOSIS — F1011 Alcohol abuse, in remission: Secondary | ICD-10-CM

## 2020-06-09 ENCOUNTER — Other Ambulatory Visit: Payer: Self-pay

## 2020-06-09 ENCOUNTER — Encounter (HOSPITAL_COMMUNITY): Payer: Self-pay | Admitting: Emergency Medicine

## 2020-06-09 ENCOUNTER — Emergency Department (HOSPITAL_COMMUNITY)
Admission: EM | Admit: 2020-06-09 | Discharge: 2020-06-10 | Disposition: A | Discharge status: Home / Self Care | Payer: Self-pay | Attending: Emergency Medicine | Admitting: Emergency Medicine

## 2020-06-09 DIAGNOSIS — F4320 Adjustment disorder, unspecified: Secondary | ICD-10-CM | POA: Diagnosis present

## 2020-06-09 DIAGNOSIS — F1490 Cocaine use, unspecified, uncomplicated: Secondary | ICD-10-CM | POA: Diagnosis present

## 2020-06-09 DIAGNOSIS — F1510 Other stimulant abuse, uncomplicated: Secondary | ICD-10-CM | POA: Diagnosis present

## 2020-06-09 DIAGNOSIS — R45851 Suicidal ideations: Secondary | ICD-10-CM

## 2020-06-09 DIAGNOSIS — N390 Urinary tract infection, site not specified: Secondary | ICD-10-CM

## 2020-06-09 DIAGNOSIS — F1910 Other psychoactive substance abuse, uncomplicated: Secondary | ICD-10-CM | POA: Diagnosis present

## 2020-06-09 DIAGNOSIS — F1520 Other stimulant dependence, uncomplicated: Secondary | ICD-10-CM | POA: Diagnosis present

## 2020-06-09 DIAGNOSIS — Z20822 Contact with and (suspected) exposure to covid-19: Secondary | ICD-10-CM | POA: Insufficient documentation

## 2020-06-09 DIAGNOSIS — F332 Major depressive disorder, recurrent severe without psychotic features: Secondary | ICD-10-CM | POA: Insufficient documentation

## 2020-06-09 LAB — I-STAT CHEM 8, ED
BUN: 6 mg/dL (ref 6–20)
Calcium, Ion: 1.12 mmol/L — ABNORMAL LOW (ref 1.15–1.40)
Chloride: 104 mmol/L (ref 98–111)
Creatinine, Ser: 0.7 mg/dL (ref 0.44–1.00)
Glucose, Bld: 106 mg/dL — ABNORMAL HIGH (ref 70–99)
HCT: 43 % (ref 36.0–46.0)
Hemoglobin: 14.6 g/dL (ref 12.0–15.0)
Potassium: 3.8 mmol/L (ref 3.5–5.1)
Sodium: 140 mmol/L (ref 135–145)
TCO2: 25 mmol/L (ref 22–32)

## 2020-06-09 LAB — COMPREHENSIVE METABOLIC PANEL
ALT: 18 U/L (ref 0–44)
AST: 23 U/L (ref 15–41)
Albumin: 3.6 g/dL (ref 3.5–5.0)
Alkaline Phosphatase: 131 U/L — ABNORMAL HIGH (ref 38–126)
Anion gap: 8 (ref 5–15)
BUN: 9 mg/dL (ref 6–20)
CO2: 25 mmol/L (ref 22–32)
Calcium: 8.8 mg/dL — ABNORMAL LOW (ref 8.9–10.3)
Chloride: 105 mmol/L (ref 98–111)
Creatinine, Ser: 0.8 mg/dL (ref 0.44–1.00)
GFR, Estimated: >60 mL/min (ref >60)
Glucose, Bld: 109 mg/dL — ABNORMAL HIGH (ref 70–99)
Potassium: 3.7 mmol/L (ref 3.5–5.1)
Sodium: 138 mmol/L (ref 135–145)
Total Bilirubin: 0.6 mg/dL (ref 0.3–1.2)
Total Protein: 6.8 g/dL (ref 6.5–8.1)

## 2020-06-09 LAB — URINALYSIS, ROUTINE W REFLEX MICROSCOPIC
Bilirubin Urine: NEGATIVE
Glucose, UA: NEGATIVE mg/dL
Ketones, ur: NEGATIVE mg/dL
Nitrite: NEGATIVE
Protein, ur: 100 mg/dL — AB
Specific Gravity, Urine: 1.013 (ref 1.005–1.030)
WBC, UA: >50 WBC/hpf — ABNORMAL HIGH (ref 0–5)
pH: 5 (ref 5.0–8.0)

## 2020-06-09 LAB — RESP PANEL BY RT-PCR (FLU A&B, COVID) ARPGX2
Influenza A by PCR: NEGATIVE
Influenza B by PCR: NEGATIVE
SARS Coronavirus 2 by RT PCR: NEGATIVE

## 2020-06-09 LAB — CBC
HCT: 41.6 % (ref 36.0–46.0)
Hemoglobin: 13.6 g/dL (ref 12.0–15.0)
MCH: 33.1 pg (ref 26.0–34.0)
MCHC: 32.7 g/dL (ref 30.0–36.0)
MCV: 101.2 fL — ABNORMAL HIGH (ref 80.0–100.0)
Platelets: 238 10*3/uL (ref 150–400)
RBC: 4.11 MIL/uL (ref 3.87–5.11)
RDW: 14.7 % (ref 11.5–15.5)
WBC: 13.5 10*3/uL — ABNORMAL HIGH (ref 4.0–10.5)
nRBC: 0 % (ref 0.0–0.2)

## 2020-06-09 LAB — RAPID URINE DRUG SCREEN, HOSP PERFORMED
Amphetamines: POSITIVE — AB
Barbiturates: NOT DETECTED
Benzodiazepines: NOT DETECTED
Cocaine: POSITIVE — AB
Opiates: NOT DETECTED
Tetrahydrocannabinol: NOT DETECTED

## 2020-06-09 LAB — ETHANOL: Alcohol, Ethyl (B): <10 mg/dL (ref <10)

## 2020-06-09 MED ORDER — LORAZEPAM 2 MG/ML IJ SOLN: 0.0000 mg | Freq: Two times a day (BID) | INTRAMUSCULAR | Status: DC

## 2020-06-09 MED ORDER — SULFAMETHOXAZOLE-TRIMETHOPRIM 800-160 MG PO TABS
1.0000 | ORAL_TABLET | Freq: Once | ORAL | Status: AC
  Administered 2020-06-09: 1 via ORAL
  Filled 2020-06-09: qty 1

## 2020-06-09 MED ORDER — LORAZEPAM 1 MG PO TABS
0.0000 mg | ORAL_TABLET | Freq: Four times a day (QID) | ORAL | Status: DC
  Administered 2020-06-10 (×2): 1 mg via ORAL
  Filled 2020-06-09 (×3): qty 1

## 2020-06-09 MED ORDER — SULFAMETHOXAZOLE-TRIMETHOPRIM 800-160 MG PO TABS
1.0000 | ORAL_TABLET | Freq: Two times a day (BID) | ORAL | Status: DC
  Administered 2020-06-09 – 2020-06-10 (×2): 1 via ORAL
  Filled 2020-06-09 (×2): qty 1

## 2020-06-09 MED ORDER — LORAZEPAM 2 MG/ML IJ SOLN
0.0000 mg | Freq: Four times a day (QID) | INTRAMUSCULAR | Status: DC
Start: 2020-06-09 — End: 2020-06-10
  Administered 2020-06-09: 2 mg via INTRAVENOUS
  Filled 2020-06-09: qty 1

## 2020-06-09 MED ORDER — THIAMINE HCL 100 MG PO TABS
100.0000 mg | ORAL_TABLET | Freq: Every day | ORAL | Status: DC
  Administered 2020-06-09 – 2020-06-10 (×2): 100 mg via ORAL
  Filled 2020-06-09 (×2): qty 1

## 2020-06-09 MED ORDER — THIAMINE HCL 100 MG/ML IJ SOLN: 100.0000 mg | Freq: Every day | INTRAMUSCULAR | Status: DC

## 2020-06-09 MED ORDER — LORAZEPAM 1 MG PO TABS: 0.0000 mg | ORAL_TABLET | Freq: Two times a day (BID) | ORAL | Status: DC

## 2020-06-09 NOTE — ED Notes (Signed)
Report given to TCU RN.  

## 2020-06-09 NOTE — ED Notes (Signed)
Pt called out requesting medication for "withdrawals". CIWA repeated. Pt given 1mg  PO of Ativan per CIWA protocol.

## 2020-06-09 NOTE — ED Provider Notes (Signed)
[facility]-EMERGENCY DEPT Provider Note   CSN: [id number] Arrival date & time: 06/09/20  3710     History Chief Complaint  Patient presents with  . Suicidal  . Urinary Tract Infection    Reilley Valentine is a 50 y.o. female.  HPI Patient presents with concern of suicidal ideation, and dysuria.  She notes substantial social difficulties currently.  She was recently become homeless, lost her job.  She denies physical pain.  She does note that she is using heroin and alcohol regularly, last yesterday.  Today she presents with concern for dysuria, but not abdominal pain, fever, chills, nausea, vomiting.  She also complains of suicidal ideation, worsening over the past days. She has previously been treated for suicidal ideation, though not since last year. She is supposed to be taking Celexa, gabapentin, but has not in some time.    History reviewed. No pertinent past medical history.  There are no problems to display for this patient.   History reviewed. No pertinent surgical history.   OB History   No obstetric history on file.     History reviewed. No pertinent family history.  Social History   Tobacco Use  . Smoking status: Never Smoker  . Smokeless tobacco: Never Used  Vaping Use  . Vaping Use: Never used  Substance Use Topics  . Alcohol use: Not Currently  . Drug use: Not Currently    Home Medications Prior to Admission medications   Not on File    Allergies    Patient has no known allergies.  Review of Systems   Review of Systems  Constitutional:       Per HPI, otherwise negative  HENT:       Per HPI, otherwise negative  Respiratory:       Per HPI, otherwise negative  Cardiovascular:       Per HPI, otherwise negative  Gastrointestinal: Negative for vomiting.  Endocrine:       Negative aside from HPI  Genitourinary:       Neg aside from HPI   Musculoskeletal:       Per HPI, otherwise negative  Skin: Negative.    Neurological: Positive for weakness. Negative for syncope.  Psychiatric/Behavioral: Positive for sleep disturbance and suicidal ideas. The patient is nervous/anxious.     Physical Exam Updated Vital Signs BP 119/76   Pulse 73   Temp 97.7 F (36.5 C) (Oral)   Resp 16   SpO2 100%   Physical Exam Vitals and nursing note reviewed.  Constitutional:      General: She is not in acute distress.    Appearance: She is well-developed.  HENT:     Head: Normocephalic and atraumatic.  Eyes:     Conjunctiva/sclera: Conjunctivae normal.  Cardiovascular:     Rate and Rhythm: Normal rate and regular rhythm.  Pulmonary:     Effort: Pulmonary effort is normal. No respiratory distress.     Breath sounds: Normal breath sounds. No stridor.  Abdominal:     General: There is no distension.    Skin:    General: Skin is warm and dry.     Comments: Flushed facial tone  Neurological:     Mental Status: She is alert and oriented to person, place, and time.     Cranial Nerves: No cranial nerve deficit.  Psychiatric:        Thought Content: Thought content includes suicidal ideation.     ED Results / Procedures / Treatments   Labs (  all labs ordered are listed, but only abnormal results are displayed) Labs Reviewed  COMPREHENSIVE METABOLIC PANEL - Abnormal; Notable for the following components:      Result Value   Glucose, Bld 109 (*)    Calcium 8.8 (*)    Alkaline Phosphatase 131 (*)    All other components within normal limits  CBC - Abnormal; Notable for the following components:   WBC 13.5 (*)    MCV 101.2 (*)    All other components within normal limits  RAPID URINE DRUG SCREEN, HOSP PERFORMED - Abnormal; Notable for the following components:   Cocaine POSITIVE (*)    Amphetamines POSITIVE (*)    All other components within normal limits  URINALYSIS, ROUTINE W REFLEX MICROSCOPIC - Abnormal; Notable for the following components:   APPearance TURBID (*)    Hgb urine dipstick  MODERATE (*)    Protein, ur 100 (*)    Leukocytes,Ua LARGE (*)    WBC, UA >50 (*)    Bacteria, UA RARE (*)    All other components within normal limits  I-STAT CHEM 8, ED - Abnormal; Notable for the following components:   Glucose, Bld 106 (*)    Calcium, Ion 1.12 (*)    All other components within normal limits  RESP PANEL BY RT-PCR (FLU A&B, COVID) ARPGX2  ETHANOL  I-STAT BETA HCG BLOOD, ED ([facility], [facility], [facility] ONLY)    EKG None  Radiology No results found.  Procedures Procedures   Medications Ordered in ED Medications  LORazepam (ATIVAN) injection 0-4 mg ( Intravenous See Alternative 06/09/20 5681)    Or  LORazepam (ATIVAN) tablet 0-4 mg (0 mg Oral Not Given 06/09/20 0808)  LORazepam (ATIVAN) injection 0-4 mg (has no administration in time range)    Or  LORazepam (ATIVAN) tablet 0-4 mg (has no administration in time range)  thiamine tablet 100 mg (has no administration in time range)    Or  thiamine (B-1) injection 100 mg (has no administration in time range)  sulfamethoxazole-trimethoprim (BACTRIM DS) 800-160 MG per tablet 1 tablet (has no administration in time range)    ED Course  I have reviewed the triage vital signs and the nursing notes.  Pertinent labs & imaging results that were available during my care of the patient were reviewed by me and considered in my medical decision making (see chart for details).    MDM Rules/Calculators/A&P Adult female with multiple social challenges, ongoing polysubstance abuse presents with dysuria and suicidal ideation.  She is awake, alert, in no distress, with a nontender abdomen, low suspicion for bacteremia, sepsis, no evidence for acute abdominal processes. Given her history of polysubstance abuse she was started on CIWA protocol, had labs, urinalysis ordered.  9:05 AM Patient found to have cocaine positive, opiate positive tox screen, otherwise labs generally unremarkable.  Patient does have urinary tract infection.  Patient has  received Bactrim.  Patient medically cleared for behavioral health evaluation. Final Clinical Impression(s) / ED Diagnoses Final diagnoses:  Suicidal ideation  Lower urinary tract infectious disease     Gerhard Munch, MD 06/09/20 [phone]

## 2020-06-09 NOTE — ED Notes (Signed)
Pt appears to be sleeping, respirations even and unlabored.

## 2020-06-09 NOTE — ED Notes (Signed)
Pt dressed out in burgundy scrubs, pt belongings located in cabinet at the nurses station (5-8) includes clothing and purse.
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
## 2020-06-09 NOTE — ED Triage Notes (Signed)
Patient is homeless. Patient was brought in by [facility] complaining of suicidal , being cold, and uti symptoms.

## 2020-06-10 DIAGNOSIS — F1910 Other psychoactive substance abuse, uncomplicated: Secondary | ICD-10-CM | POA: Diagnosis present

## 2020-06-10 DIAGNOSIS — F1490 Cocaine use, unspecified, uncomplicated: Secondary | ICD-10-CM | POA: Diagnosis present

## 2020-06-10 DIAGNOSIS — F1510 Other stimulant abuse, uncomplicated: Secondary | ICD-10-CM | POA: Diagnosis present

## 2020-06-10 DIAGNOSIS — F4320 Adjustment disorder, unspecified: Secondary | ICD-10-CM | POA: Diagnosis present

## 2020-06-10 DIAGNOSIS — F1520 Other stimulant dependence, uncomplicated: Secondary | ICD-10-CM | POA: Diagnosis present

## 2020-06-10 NOTE — Discharge Instructions (Signed)
Substance abuse resources and Residential Options:  [facility]: [address] [place], [place] [address]  [email or web address] .Gerre Scull [phone] [facility] offers 22 beds (14 during COVID-19 regulations), hot meals, clothing, case management, Life Skills classes and, most importantly, hope. Residents have a safe and nurturing place to stay while stabilizing their lives and making plans for the future. [facility] offers the ability to reach more women during those cold winter months. Residents meet regularly with the [facility] Case Managers to determine their personalized plan of action. The Case Manager makes residents aware of resources and aids them in navigating the path towards permanent housing with goal setting and completion. A counselor is also available to help navigate healing.  [facility] day residential substance abuse facility (not an option if you have active assault charges). [address], [place], [place] [address] Phone: [phone]: Ask for Drema Pry in admissions to complete intake if interested in pursuing this option.  [facility]: Can get intake scheduled; (not an option if you have active assault charges). [address] [place], [place] [phone]) Call Mon-Fri.  [facility]): (offers outpatient therapy and intensive outpatient substance abuse therapy).  [address] [place], [place] [address] Phone: [phone]  [facility]: Offers FREE recovery skills classes, support groups, 1:1 Peer Support, and Compeer Classes. [address] [place], [place] [address] Phone: [phone] (Call to complete intake).   [facility] [address] [place], [place] [address] Phone: [phone] ext [phone]  [facility] provides food, shelter and other programs and services to the homeless men of [place] through our [employer] program.  By  offering safe shelter, three meals a day, clean clothing, Biblical counseling, financial planning, vocational training, GED/education and employment assistance, we've helped mend the shattered lives of many homeless men since opening in 1974.  We have approximately 267 beds available, with a max of 312 beds including mats for emergency situations and currently house an average of 270 men a night.  Prospective Client Check-In Information Photo ID Required (State/ Out of State/ [facility]) - if photo ID is not available, clients are required to have a printout of a police/sheriff's criminal history report. Help out with chores around the [facility]. No sex offender of any type (pending, charged, registered and/or any other sex related offenses) will be permitted to check in. Must be willing to abide by all rules, regulations, and policies established by the [employer]. The following will be provided - shelter, food, clothing, and biblical counseling. If you or someone you know is in need of assistance at our [facility] shelter in [place], [place], please call [phone] ext. [phone].  Outpatient Mental Health:  [facility] [place] Phone: [phone] Address: [address] [place], [place] [address]  Hours:  Sunday Closed Monday Open 24 hours Tuesday Open 24 hours Wednesday Open 24 hours Thursday Open 24 hours Friday Open 24 hours Saturday Closed  [facility] [address]  [place], [place] [address] Phone: [phone]  (provides 24/7 walk-in beheavioral health assessments, outpatient medication management and therapy during regular business hours).    [facility]: offers peer support, support groups, recovery skills classes, and compeer programs for free to the community.  Phone: [phone] Located in: [facility]  Address: [address] [place], [place] [address] Hours:   Sunday Closed Monday 9AM-4:30PM Tuesday 9AM-4:30PM Wednesday 9AM-4:30PM Thursday  9AM-4:30PM Friday 9AM-2:30PM Saturday Closed     [facility]:  *[facility] [address] [place], [place] [address] p: [phone]

## 2020-06-10 NOTE — Progress Notes (Signed)
Pt has been provided with the following outpatient/residential substance abuse resources:   Substance abuse resources and Residential Options:  [facility]: [address] [place], [place] [address]  [email or web address] .org [phone] [facility] offers 22 beds (14 during COVID-19 regulations), hot meals, clothing, case management, Life Skills classes and, most importantly, hope. Residents have a safe and nurturing place to stay while stabilizing their lives and making plans for the future. [facility] offers the ability to reach more women during those cold winter months. Residents meet regularly with the [facility] Case Managers to determine their personalized plan of action. The Case Manager makes residents aware of resources and aids them in navigating the path towards permanent housing with goal setting and completion. A counselor is also available to help navigate healing.  [facility] day residential substance abuse facility (not an option if you have active assault charges). [address], [place], [place] [address] Phone: [phone]: Ask for Drema Pry in admissions to complete intake if interested in pursuing this option.  [facility]: Can get intake scheduled; (not an option if you have active assault charges). [address] [place], [place] [phone]) Call Mon-Fri.  [facility]): (offers outpatient therapy and intensive outpatient substance abuse therapy).  [address], [place], [place] [address] Phone: [phone]  [facility]: Offers FREE recovery skills classes, support groups, 1:1 Peer Support, and Compeer Classes. [address], [place], [place] [address] Phone: [phone] (Call to complete intake).   [facility] [address] [place], [place] [address] Phone: [phone] ext [phone]  [facility] provides food, shelter and other programs and  services to the homeless men of [place] through our [employer] program.  By offering safe shelter, three meals a day, clean clothing, Biblical counseling, financial planning, vocational training, GED/education and employment assistance, we've helped mend the shattered lives of many homeless men since opening in 1974.  We have approximately 267 beds available, with a max of 312 beds including mats for emergency situations and currently house an average of 270 men a night.  Prospective Client Check-In Information Photo ID Required (State/ Out of State/ [facility]) - if photo ID is not available, clients are required to have a printout of a police/sheriff's criminal history report. Help out with chores around the [facility]. No sex offender of any type (pending, charged, registered and/or any other sex related offenses) will be permitted to check in. Must be willing to abide by all rules, regulations, and policies established by the [employer]. The following will be provided - shelter, food, clothing, and biblical counseling. If you or someone you know is in need of assistance at our [facility] shelter in [place], [place], please call [phone] ext. [phone].  Outpatient Mental Health:  [facility] [place] Phone: [phone] Address: [address] [place], [place] [address]  Hours:  Sunday Closed Monday Open 24 hours Tuesday Open 24 hours Wednesday Open 24 hours Thursday Open 24 hours Friday Open 24 hours Saturday Closed  [facility] [address]  [place], [place] [address] Phone: [phone]  (provides 24/7 walk-in beheavioral health assessments, outpatient medication management and therapy during regular business hours).    [facility]: offers peer support, support groups, recovery skills classes, and compeer programs for free to the community.  Phone: [phone] Located in: [facility]  Address: [address], [place],  [place] [address] Hours:  Sunday Closed Monday 9AM-4:30PM Tuesday 9AM-4:30PM Wednesday 9AM-4:30PM Thursday 9AM-4:30PM Friday 9AM-2:30PM Saturday Closed   [facility]:  *[facility] [address] [place], [place] [address] p: [phone]    Ethel Rana. Alan Ripper, MSW, LCSW Clinical Social Worker 06/10/2020 12:05 PM

## 2020-06-10 NOTE — ED Provider Notes (Signed)
Emergency Medicine Observation Re-evaluation Note  Tina Torres is a 50 y.o. female, seen on rounds today.  Pt initially presented to the ED for complaints of Suicidal and Urinary Tract Infection Currently, the patient is sleeping.  Physical Exam  BP 120/73 (BP Location: Left Arm)   Pulse 75   Temp 98 F (36.7 C) (Oral)   Resp 16   SpO2 98%  Physical Exam General: No distress Musculoskeletal: No deformities Lungs: No increased work of breathing Psych: Suicidal  ED Course / MDM  EKG:   I have reviewed the labs performed to date as well as medications administered while in observation.  Recent changes in the last 24 hours include none.  Plan  Current plan is for psych evaluation/placement. Patient is not under full IVC at this time.   Gerhard Munch, MD 06/10/20 0830

## 2020-06-10 NOTE — Progress Notes (Signed)
06/10/2020  1420  While discharging patient, patient got upset because we did not have her mother's number for her to call. Patient states she thought by coming to the ED we would be able to help her with contacting her family. Per pt her mom lives in [place]. Reviewed d/c instructions with the list of resources. Patient stated we were not helping her. As we were walking to the lobby she made the statement that after lying in bed for three days that gave her the idea to slit her wrist. The tech finished walking her to the lobby and she told the tech she was going to go to an abandon house and finish her plan to kill herself. Notified psych NP of patient statements.
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
## 2020-06-11 ENCOUNTER — Encounter (HOSPITAL_COMMUNITY): Payer: Self-pay | Admitting: Psychiatry

## 2020-07-02 ENCOUNTER — Emergency Department (HOSPITAL_COMMUNITY): Payer: Self-pay

## 2020-07-02 ENCOUNTER — Emergency Department (HOSPITAL_COMMUNITY)
Admission: EM | Admit: 2020-07-02 | Discharge: 2020-07-03 | Disposition: A | Discharge status: Home / Self Care | Payer: Self-pay | Attending: Emergency Medicine | Admitting: Emergency Medicine

## 2020-07-02 ENCOUNTER — Encounter (HOSPITAL_COMMUNITY): Payer: Self-pay | Admitting: Emergency Medicine

## 2020-07-02 ENCOUNTER — Other Ambulatory Visit: Payer: Self-pay

## 2020-07-02 DIAGNOSIS — Z5900 Homelessness unspecified: Secondary | ICD-10-CM | POA: Insufficient documentation

## 2020-07-02 DIAGNOSIS — S0083XA Contusion of other part of head, initial encounter: Secondary | ICD-10-CM

## 2020-07-02 DIAGNOSIS — R45851 Suicidal ideations: Secondary | ICD-10-CM | POA: Insufficient documentation

## 2020-07-02 DIAGNOSIS — S0512XA Contusion of eyeball and orbital tissues, left eye, initial encounter: Secondary | ICD-10-CM | POA: Insufficient documentation

## 2020-07-02 DIAGNOSIS — F1910 Other psychoactive substance abuse, uncomplicated: Secondary | ICD-10-CM | POA: Insufficient documentation

## 2020-07-02 DIAGNOSIS — Z20822 Contact with and (suspected) exposure to covid-19: Secondary | ICD-10-CM | POA: Insufficient documentation

## 2020-07-02 DIAGNOSIS — F1420 Cocaine dependence, uncomplicated: Secondary | ICD-10-CM | POA: Insufficient documentation

## 2020-07-02 DIAGNOSIS — F1520 Other stimulant dependence, uncomplicated: Secondary | ICD-10-CM | POA: Insufficient documentation

## 2020-07-02 DIAGNOSIS — S0990XA Unspecified injury of head, initial encounter: Secondary | ICD-10-CM | POA: Insufficient documentation

## 2020-07-02 DIAGNOSIS — F1120 Opioid dependence, uncomplicated: Secondary | ICD-10-CM | POA: Insufficient documentation

## 2020-07-02 DIAGNOSIS — F332 Major depressive disorder, recurrent severe without psychotic features: Secondary | ICD-10-CM | POA: Insufficient documentation

## 2020-07-02 DIAGNOSIS — F1020 Alcohol dependence, uncomplicated: Secondary | ICD-10-CM | POA: Insufficient documentation

## 2020-07-02 LAB — COMPREHENSIVE METABOLIC PANEL
ALT: 18 U/L (ref 0–44)
AST: 29 U/L (ref 15–41)
Albumin: 3.4 g/dL — ABNORMAL LOW (ref 3.5–5.0)
Alkaline Phosphatase: 105 U/L (ref 38–126)
Anion gap: 7 (ref 5–15)
BUN: 8 mg/dL (ref 6–20)
CO2: 27 mmol/L (ref 22–32)
Calcium: 8.8 mg/dL — ABNORMAL LOW (ref 8.9–10.3)
Chloride: 104 mmol/L (ref 98–111)
Creatinine, Ser: 1.05 mg/dL — ABNORMAL HIGH (ref 0.44–1.00)
GFR, Estimated: >60 mL/min (ref >60)
Glucose, Bld: 109 mg/dL — ABNORMAL HIGH (ref 70–99)
Potassium: 3.4 mmol/L — ABNORMAL LOW (ref 3.5–5.1)
Sodium: 138 mmol/L (ref 135–145)
Total Bilirubin: 0.5 mg/dL (ref 0.3–1.2)
Total Protein: 7.1 g/dL (ref 6.5–8.1)

## 2020-07-02 LAB — URINALYSIS, ROUTINE W REFLEX MICROSCOPIC
Bacteria, UA: NONE SEEN
Bilirubin Urine: NEGATIVE
Glucose, UA: NEGATIVE mg/dL
Ketones, ur: NEGATIVE mg/dL
Nitrite: NEGATIVE
Protein, ur: NEGATIVE mg/dL
Specific Gravity, Urine: 1.024 (ref 1.005–1.030)
pH: 5 (ref 5.0–8.0)

## 2020-07-02 LAB — CBC WITH DIFFERENTIAL/PLATELET
Abs Immature Granulocytes: 0.1 10*3/uL — ABNORMAL HIGH (ref 0.00–0.07)
Basophils Absolute: 0.1 10*3/uL (ref 0.0–0.1)
Basophils Relative: 0 %
Eosinophils Absolute: 0.2 10*3/uL (ref 0.0–0.5)
Eosinophils Relative: 2 %
HCT: 44.7 % (ref 36.0–46.0)
Hemoglobin: 14.6 g/dL (ref 12.0–15.0)
Immature Granulocytes: 1 %
Lymphocytes Relative: 17 %
Lymphs Abs: 2.2 10*3/uL (ref 0.7–4.0)
MCH: 32.9 pg (ref 26.0–34.0)
MCHC: 32.7 g/dL (ref 30.0–36.0)
MCV: 100.7 fL — ABNORMAL HIGH (ref 80.0–100.0)
Monocytes Absolute: 0.7 10*3/uL (ref 0.1–1.0)
Monocytes Relative: 5 %
Neutro Abs: 9.5 10*3/uL — ABNORMAL HIGH (ref 1.7–7.7)
Neutrophils Relative %: 75 %
Platelets: 308 10*3/uL (ref 150–400)
RBC: 4.44 MIL/uL (ref 3.87–5.11)
RDW: 14 % (ref 11.5–15.5)
WBC: 12.8 10*3/uL — ABNORMAL HIGH (ref 4.0–10.5)
nRBC: 0 % (ref 0.0–0.2)

## 2020-07-02 LAB — I-STAT BETA HCG BLOOD, ED (MC, WL, AP ONLY): I-stat hCG, quantitative: 17.6 m[IU]/mL — ABNORMAL HIGH (ref <5)

## 2020-07-02 LAB — SALICYLATE LEVEL: Salicylate Lvl: <7 mg/dL — ABNORMAL LOW (ref 7.0–30.0)

## 2020-07-02 LAB — ETHANOL: Alcohol, Ethyl (B): <10 mg/dL (ref <10)

## 2020-07-02 LAB — ACETAMINOPHEN LEVEL: Acetaminophen (Tylenol), Serum: <10 ug/mL — ABNORMAL LOW (ref 10–30)

## 2020-07-02 MED ORDER — LORAZEPAM 2 MG/ML IJ SOLN
0.0000 mg | Freq: Two times a day (BID) | INTRAMUSCULAR | Status: DC
Start: 2020-07-05 — End: 2020-07-03

## 2020-07-02 MED ORDER — CLONIDINE HCL 0.1 MG PO TABS: 0.1000 mg | ORAL_TABLET | Freq: Two times a day (BID) | ORAL | Status: DC

## 2020-07-02 MED ORDER — HYDROXYZINE HCL 25 MG PO TABS
25.0000 mg | ORAL_TABLET | Freq: Four times a day (QID) | ORAL | Status: DC | PRN
  Administered 2020-07-02: 25 mg via ORAL
  Filled 2020-07-02: qty 1

## 2020-07-02 MED ORDER — LORAZEPAM 1 MG PO TABS: 0.0000 mg | ORAL_TABLET | Freq: Two times a day (BID) | ORAL | Status: DC

## 2020-07-02 MED ORDER — NICOTINE 21 MG/24HR TD PT24
21.0000 mg | MEDICATED_PATCH | Freq: Every day | TRANSDERMAL | Status: DC
  Administered 2020-07-03: 21 mg via TRANSDERMAL
  Filled 2020-07-02: qty 1

## 2020-07-02 MED ORDER — LORAZEPAM 2 MG/ML IJ SOLN
0.0000 mg | Freq: Four times a day (QID) | INTRAMUSCULAR | Status: DC
Start: 2020-07-02 — End: 2020-07-03

## 2020-07-02 MED ORDER — THIAMINE HCL 100 MG PO TABS
100.0000 mg | ORAL_TABLET | Freq: Every day | ORAL | Status: DC
  Administered 2020-07-03: 100 mg via ORAL
  Filled 2020-07-02: qty 1

## 2020-07-02 MED ORDER — LORAZEPAM 1 MG PO TABS
1.0000 mg | ORAL_TABLET | Freq: Once | ORAL | Status: AC
  Administered 2020-07-02: 1 mg via ORAL
  Filled 2020-07-02: qty 1

## 2020-07-02 MED ORDER — METHOCARBAMOL 500 MG PO TABS: 500.0000 mg | ORAL_TABLET | Freq: Three times a day (TID) | ORAL | Status: DC | PRN

## 2020-07-02 MED ORDER — ONDANSETRON 4 MG PO TBDP
4.0000 mg | ORAL_TABLET | Freq: Four times a day (QID) | ORAL | Status: DC | PRN
  Administered 2020-07-02: 4 mg via ORAL
  Filled 2020-07-02: qty 1

## 2020-07-02 MED ORDER — LORAZEPAM 1 MG PO TABS
0.0000 mg | ORAL_TABLET | Freq: Four times a day (QID) | ORAL | Status: DC
  Administered 2020-07-02: 2 mg via ORAL
  Administered 2020-07-03: 1 mg via ORAL
  Filled 2020-07-02: qty 1
  Filled 2020-07-02: qty 2

## 2020-07-02 MED ORDER — LOPERAMIDE HCL 2 MG PO CAPS: 2.0000 mg | ORAL_CAPSULE | ORAL | Status: DC | PRN

## 2020-07-02 MED ORDER — CLONIDINE HCL 0.1 MG PO TABS: 0.1000 mg | ORAL_TABLET | Freq: Every day | ORAL | Status: DC

## 2020-07-02 MED ORDER — THIAMINE HCL 100 MG/ML IJ SOLN: 100.0000 mg | Freq: Every day | INTRAMUSCULAR | Status: DC

## 2020-07-02 MED ORDER — CLONIDINE HCL 0.1 MG PO TABS
0.1000 mg | ORAL_TABLET | Freq: Four times a day (QID) | ORAL | Status: DC
  Administered 2020-07-02 – 2020-07-03 (×2): 0.1 mg via ORAL
  Filled 2020-07-02 (×2): qty 1

## 2020-07-02 MED ORDER — DICYCLOMINE HCL 20 MG PO TABS: 20.0000 mg | ORAL_TABLET | Freq: Four times a day (QID) | ORAL | Status: DC | PRN

## 2020-07-02 MED ORDER — NAPROXEN 250 MG PO TABS: 500.0000 mg | ORAL_TABLET | Freq: Two times a day (BID) | ORAL | Status: DC | PRN
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
## 2020-07-02 NOTE — ED Notes (Signed)
Dinner tray ordered.
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
## 2020-07-02 NOTE — BH Assessment (Addendum)
Disposition:   Per TTS staff [facility]), patient in need of bed placement. Dual Dx's bed. @ 2214, [facility] (Tosin, RN), notified of patient's bed needs. Awaiting updates from [facility] regarding [facility] bed availability.   Melynda Ripple, MS, LCAS Disposition Clinician [phone]

## 2020-07-03 ENCOUNTER — Inpatient Hospital Stay (HOSPITAL_COMMUNITY)
Admission: AD | Admit: 2020-07-03 | Discharge: 2020-07-10 | DRG: 885 | Disposition: A | Discharge status: Home / Self Care | Payer: No Typology Code available for payment source | Source: Intra-hospital | Attending: Psychiatry | Admitting: Psychiatry

## 2020-07-03 ENCOUNTER — Other Ambulatory Visit: Payer: Self-pay | Admitting: Registered Nurse

## 2020-07-03 ENCOUNTER — Encounter (HOSPITAL_COMMUNITY): Payer: Self-pay | Admitting: Registered Nurse

## 2020-07-03 DIAGNOSIS — R000 Tachycardia, unspecified: Secondary | ICD-10-CM | POA: Diagnosis present

## 2020-07-03 DIAGNOSIS — F332 Major depressive disorder, recurrent severe without psychotic features: Principal | ICD-10-CM | POA: Diagnosis present

## 2020-07-03 DIAGNOSIS — Z79899 Other long term (current) drug therapy: Secondary | ICD-10-CM

## 2020-07-03 DIAGNOSIS — F10239 Alcohol dependence with withdrawal, unspecified: Secondary | ICD-10-CM | POA: Diagnosis present

## 2020-07-03 DIAGNOSIS — F1920 Other psychoactive substance dependence, uncomplicated: Secondary | ICD-10-CM | POA: Diagnosis not present

## 2020-07-03 DIAGNOSIS — F331 Major depressive disorder, recurrent, moderate: Secondary | ICD-10-CM

## 2020-07-03 DIAGNOSIS — R112 Nausea with vomiting, unspecified: Secondary | ICD-10-CM | POA: Diagnosis present

## 2020-07-03 DIAGNOSIS — Z20822 Contact with and (suspected) exposure to covid-19: Secondary | ICD-10-CM | POA: Diagnosis present

## 2020-07-03 DIAGNOSIS — F1910 Other psychoactive substance abuse, uncomplicated: Secondary | ICD-10-CM | POA: Diagnosis not present

## 2020-07-03 DIAGNOSIS — R45851 Suicidal ideations: Secondary | ICD-10-CM | POA: Diagnosis present

## 2020-07-03 DIAGNOSIS — F17210 Nicotine dependence, cigarettes, uncomplicated: Secondary | ICD-10-CM | POA: Diagnosis present

## 2020-07-03 DIAGNOSIS — Z5900 Homelessness unspecified: Secondary | ICD-10-CM | POA: Diagnosis not present

## 2020-07-03 DIAGNOSIS — F1520 Other stimulant dependence, uncomplicated: Secondary | ICD-10-CM | POA: Diagnosis present

## 2020-07-03 DIAGNOSIS — F1423 Cocaine dependence with withdrawal: Secondary | ICD-10-CM | POA: Diagnosis present

## 2020-07-03 DIAGNOSIS — F1420 Cocaine dependence, uncomplicated: Secondary | ICD-10-CM

## 2020-07-03 DIAGNOSIS — F1994 Other psychoactive substance use, unspecified with psychoactive substance-induced mood disorder: Secondary | ICD-10-CM | POA: Diagnosis not present

## 2020-07-03 DIAGNOSIS — F1123 Opioid dependence with withdrawal: Secondary | ICD-10-CM | POA: Diagnosis present

## 2020-07-03 LAB — GC/CHLAMYDIA PROBE AMP (~~LOC~~) NOT AT ARMC
Chlamydia: NEGATIVE
Comment: NEGATIVE
Comment: NORMAL
Neisseria Gonorrhea: POSITIVE — AB

## 2020-07-03 LAB — RPR: RPR Ser Ql: NONREACTIVE

## 2020-07-03 LAB — PREGNANCY, URINE: Preg Test, Ur: NEGATIVE

## 2020-07-03 LAB — RESP PANEL BY RT-PCR (FLU A&B, COVID) ARPGX2
Influenza A by PCR: NEGATIVE
Influenza B by PCR: NEGATIVE
SARS Coronavirus 2 by RT PCR: NEGATIVE

## 2020-07-03 LAB — HIV ANTIBODY (ROUTINE TESTING W REFLEX): HIV Screen 4th Generation wRfx: NONREACTIVE

## 2020-07-03 MED ORDER — LORAZEPAM 1 MG PO TABS
1.0000 mg | ORAL_TABLET | Freq: Four times a day (QID) | ORAL | Status: AC | PRN
  Administered 2020-07-03 – 2020-07-06 (×8): 1 mg via ORAL
  Filled 2020-07-03 (×9): qty 1

## 2020-07-03 MED ORDER — ADULT MULTIVITAMIN W/MINERALS CH
1.0000 | ORAL_TABLET | Freq: Every day | ORAL | Status: DC
  Administered 2020-07-03 – 2020-07-10 (×8): 1 via ORAL
  Filled 2020-07-03 (×11): qty 1

## 2020-07-03 MED ORDER — NICOTINE 21 MG/24HR TD PT24
21.0000 mg | MEDICATED_PATCH | Freq: Every day | TRANSDERMAL | Status: DC
  Administered 2020-07-04 – 2020-07-09 (×6): 21 mg via TRANSDERMAL
  Filled 2020-07-03 (×9): qty 1

## 2020-07-03 MED ORDER — ALUM & MAG HYDROXIDE-SIMETH 200-200-20 MG/5ML PO SUSP: 30.0000 mL | ORAL | Status: DC | PRN

## 2020-07-03 MED ORDER — ONDANSETRON 4 MG PO TBDP
4.0000 mg | ORAL_TABLET | Freq: Four times a day (QID) | ORAL | Status: DC | PRN
  Administered 2020-07-03 – 2020-07-04 (×3): 4 mg via ORAL
  Filled 2020-07-03 (×3): qty 1

## 2020-07-03 MED ORDER — THIAMINE HCL 100 MG PO TABS
100.0000 mg | ORAL_TABLET | Freq: Every day | ORAL | Status: DC
  Administered 2020-07-04 – 2020-07-10 (×7): 100 mg via ORAL
  Filled 2020-07-03 (×9): qty 1

## 2020-07-03 MED ORDER — HYDROXYZINE HCL 25 MG PO TABS
25.0000 mg | ORAL_TABLET | Freq: Four times a day (QID) | ORAL | Status: AC | PRN
  Administered 2020-07-03 – 2020-07-06 (×4): 25 mg via ORAL
  Filled 2020-07-03 (×4): qty 1

## 2020-07-03 MED ORDER — CITALOPRAM HYDROBROMIDE 40 MG PO TABS
40.0000 mg | ORAL_TABLET | Freq: Every day | ORAL | Status: DC
  Administered 2020-07-03 – 2020-07-10 (×8): 40 mg via ORAL
  Filled 2020-07-03: qty 2
  Filled 2020-07-03 (×3): qty 1
  Filled 2020-07-03: qty 2
  Filled 2020-07-03 (×2): qty 1
  Filled 2020-07-03: qty 7
  Filled 2020-07-03: qty 1
  Filled 2020-07-03 (×2): qty 7
  Filled 2020-07-03 (×2): qty 1

## 2020-07-03 MED ORDER — GABAPENTIN 400 MG PO CAPS
400.0000 mg | ORAL_CAPSULE | Freq: Three times a day (TID) | ORAL | Status: DC
  Administered 2020-07-03 – 2020-07-10 (×22): 400 mg via ORAL
  Filled 2020-07-03: qty 21
  Filled 2020-07-03: qty 1
  Filled 2020-07-03: qty 21
  Filled 2020-07-03 (×6): qty 1
  Filled 2020-07-03: qty 21
  Filled 2020-07-03 (×2): qty 1
  Filled 2020-07-03: qty 21
  Filled 2020-07-03: qty 1
  Filled 2020-07-03: qty 21
  Filled 2020-07-03 (×2): qty 1
  Filled 2020-07-03: qty 21
  Filled 2020-07-03 (×3): qty 1
  Filled 2020-07-03: qty 21
  Filled 2020-07-03 (×6): qty 1
  Filled 2020-07-03 (×2): qty 21

## 2020-07-03 MED ORDER — MAGNESIUM HYDROXIDE 400 MG/5ML PO SUSP: 30.0000 mL | Freq: Every day | ORAL | Status: DC | PRN

## 2020-07-03 MED ORDER — LOPERAMIDE HCL 2 MG PO CAPS
2.0000 mg | ORAL_CAPSULE | ORAL | Status: AC | PRN
Start: 2020-07-03 — End: 2020-07-06
  Administered 2020-07-04: 2 mg via ORAL
  Filled 2020-07-03: qty 1

## 2020-07-03 MED ORDER — POTASSIUM CHLORIDE CRYS ER 20 MEQ PO TBCR
40.0000 meq | EXTENDED_RELEASE_TABLET | Freq: Once | ORAL | Status: AC
  Administered 2020-07-03: 40 meq via ORAL
  Filled 2020-07-03: qty 2

## 2020-07-03 MED ORDER — ACETAMINOPHEN 325 MG PO TABS
650.0000 mg | ORAL_TABLET | Freq: Four times a day (QID) | ORAL | Status: DC | PRN
  Administered 2020-07-03 – 2020-07-06 (×4): 650 mg via ORAL
  Filled 2020-07-03 (×5): qty 2

## 2020-07-03 NOTE — Progress Notes (Signed)
Patient information has been sent to [facility] via secure chat to review for potential admission. Patient meets inpatient criteria per Nira Conn, NP.   Situation ongoing, CSW will continue to monitor progress.    Signed:  Damita Dunnings, MSW, LCSW-A  07/03/2020 8:41 AM

## 2020-07-03 NOTE — ED Provider Notes (Signed)
6:04 AM Pt reassessed.  She appears to be withdrawing, but denies all infectious symptoms.  Labs do show leukocytosis however I suspect this is 2/2 her drug usage and there is some element of dehydration. Pt is drinking without difficulty.  No wounds with signs of secondary infections.  Remains afebrile here in the ED.  Pt reports she does not menstruate and u-preg was negative.  COVID negative and no evidence of UTI.  Milk hypokalemia replaced in the ED.     Tina Torres, Boyd Kerbs 07/03/20 6767    Geoffery Lyons, MD 07/04/20 0530

## 2020-07-03 NOTE — Progress Notes (Signed)
Pt accepted to [facility]     Patient meets inpatient criteria per Nira Conn, NP.    Dr. Jola Babinski is the attending provider.    Call report to [phone]    Mikeal Hawthorne, RN @ [facility] ED notified.     Pt scheduled  to arrive at [facility] at 1100.   Damita Dunnings, MSW, LCSW-A  9:37 AM 07/03/2020

## 2020-07-03 NOTE — ED Notes (Signed)
Meal tray given to patient.

## 2020-07-03 NOTE — Tx Team (Signed)
Initial Treatment Plan 07/03/2020 2:13 PM Tina Torres [id number]    PATIENT STRESSORS: Financial difficulties Health problems Medication change or noncompliance Substance abuse   PATIENT STRENGTHS: General fund of knowledge Physical Health   PATIENT IDENTIFIED PROBLEMS: Depression  Substance abuse  Suicidal ideation    "I need detox"  "Figure out where I'm going when I leave"           DISCHARGE CRITERIA:  Adequate post-discharge living arrangements Need for constant or close observation no longer present Reduction of life-threatening or endangering symptoms to within safe limits Verbal commitment to aftercare and medication compliance  PRELIMINARY DISCHARGE PLAN: Outpatient therapy Substance abuse treatment  PATIENT/FAMILY INVOLVEMENT: This treatment plan has been presented to and reviewed with the patient, Tina Torres.  The patient and family have been given the opportunity to ask questions and make suggestions.  Levin Bacon, RN 07/03/2020, 2:13 PM

## 2020-07-03 NOTE — ED Notes (Signed)
Patients escorted to EMS bay where [facility] accepted patient. Belongings from locker and security obtained and given to transport to take with patient.

## 2020-07-03 NOTE — ED Notes (Addendum)
Pt is lethargic but AOx4 and cooperative with this RN while getting blood and COVID swabbing. Pt reports no pain at this time and no reports of SI at this time. Pt denies hallucinations.

## 2020-07-03 NOTE — ED Notes (Signed)
[facility] contacted to transport patient to [facility] at 11.

## 2020-07-03 NOTE — Progress Notes (Signed)
Tina Torres is a 50 year female being admitted voluntarily to [facility] from [facility].  She presented to the ED with depressive symptoms, substance use, and suicidal ideation with a plan to jump from an overpass. She has been homeless for two months and friends filed a missing person's report. Law enforcement located her and she agreed she needs help for her mental health and substance abuse problems.  Pt reports an extensive history of substance use. She says she is currently using alcohol, crack, and either heroin or Fentanyl daily.  During [facility] admission, she was very drowsy and falling asleep during the assessment.  She reported she had an ativan prior to coming over.  She continues to endorse depression.  Denies SI/HI or AVH.  She verbally agreed to not harm herself on the unit.  Oriented her to the unit.  Admission paperwork completed and signed.  Suicide safety plan reviewed, given to the patient to complete and return to her nurse.  Belongings searched and secured in locker # 20.  No contraband found.  Skin search completed and Tattoos R calf, L foot, L upper back, generalized scabs all over body.  Bruise noted around her left eye.  Q 15-minute checks initiated for safety.  We will continue monitor the progress towards her goals.

## 2020-07-03 NOTE — ED Notes (Signed)
Lab to add on GC/Clamydia

## 2020-07-04 DIAGNOSIS — F1920 Other psychoactive substance dependence, uncomplicated: Secondary | ICD-10-CM

## 2020-07-04 DIAGNOSIS — F1994 Other psychoactive substance use, unspecified with psychoactive substance-induced mood disorder: Secondary | ICD-10-CM

## 2020-07-04 MED ORDER — CEFTRIAXONE SODIUM 500 MG IJ SOLR
500.0000 mg | Freq: Once | INTRAMUSCULAR | Status: AC
  Administered 2020-07-04: 500 mg via INTRAMUSCULAR
  Filled 2020-07-04: qty 500

## 2020-07-04 MED ORDER — METRONIDAZOLE 500 MG PO TABS
500.0000 mg | ORAL_TABLET | Freq: Two times a day (BID) | ORAL | Status: DC
  Administered 2020-07-04 – 2020-07-10 (×13): 500 mg via ORAL
  Filled 2020-07-04 (×4): qty 1
  Filled 2020-07-04: qty 4
  Filled 2020-07-04: qty 1
  Filled 2020-07-04: qty 4
  Filled 2020-07-04 (×8): qty 1
  Filled 2020-07-04: qty 4

## 2020-07-04 MED ORDER — DOXYCYCLINE HYCLATE 100 MG PO TABS
100.0000 mg | ORAL_TABLET | Freq: Two times a day (BID) | ORAL | Status: DC
  Administered 2020-07-04 – 2020-07-10 (×13): 100 mg via ORAL
  Filled 2020-07-04: qty 4
  Filled 2020-07-04 (×2): qty 1
  Filled 2020-07-04 (×2): qty 4
  Filled 2020-07-04 (×11): qty 1

## 2020-07-04 MED ORDER — FOLIC ACID 1 MG PO TABS
1.0000 mg | ORAL_TABLET | Freq: Every day | ORAL | Status: DC
  Administered 2020-07-04 – 2020-07-10 (×7): 1 mg via ORAL
  Filled 2020-07-04 (×9): qty 1

## 2020-07-04 NOTE — Progress Notes (Signed)
RN attempted several times to get pt to take medications.  Pt is too drowsy and says she will take this afternoon.
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
## 2020-07-04 NOTE — [facility] Counselor (Signed)
CSW attempted to complete PSA but pt was sleeping.   Fredirick Lathe, LCSWA Clinicial Social Worker [employer]

## 2020-07-04 NOTE — [facility] Group Notes (Signed)
[facility] LCSW Group Therapy   1:15 PM Type of Therapy: Group Therapy Type of Therapy:  Group Therapy  Participation Level:  Did Not Attend  Modes of Intervention: Clarification, Confrontation, Discussion, Education, Exploration, Limit-setting, Orientation, Problem-solving, Rapport Building, Reality Testing, Socialization and Support  Summary of Progress/Problems: The topic for group today was emotional regulation. This group focused on both positive and negative emotion identification and allowed group members to process ways to identify feelings, regulate negative emotions, and find healthy ways to manage internal/external emotions. Group members were asked to reflect on a time when their reaction to an emotion led to a negative outcome and explored how alternative responses using emotion regulation would have benefited them. Group members were also asked to discuss a time when emotion regulation was utilized when a negative emotion was experienced.    Kenneth Cuaresma, LCSWA Clinicial Social Worker [facility]  

## 2020-07-04 NOTE — Progress Notes (Signed)
Recreation Therapy Notes  Date: 6.29.22 Time: 0930 Location: [address] Dayroom  Group Topic: Stress Management   Goal Area(s) Addresses:  Patient will actively participate in stress management techniques presented during session.  Patient will successfully identify benefit of practicing stress management post d/c.   Intervention: Guided exercise with ambient sound and script  Activity :Guided Imagery  LRT explained to patients the technique of guided imagery and what it entailed.  Patients were asked to participate in the technique introduced during session. LRT gave suggestions of ways to access scripts post d/c and encouraged patients to explore Youtube and other apps available on smartphones, tablets, and computers.   Education:  Stress Management, Discharge Planning.   Education Outcome: Acknowledges education  Clinical Observations/Feedback: Patient did not participate in group session.    Caroll Rancher, LRT/CTRS         Caroll Rancher A 07/04/2020 11:34 AM

## 2020-07-04 NOTE — H&P (Signed)
Psychiatric Admission Assessment Adult  Patient Identification: Tina Torres MRN:  [id number] Date of Evaluation:  07/04/2020 Chief Complaint:  MDD (major depressive disorder), recurrent severe, without psychosis (HCC) [F33.2] Principal Diagnosis: <principal problem not specified> Diagnosis:  Active Problems:   MDD (major depressive disorder), recurrent severe, without psychosis (HCC)  History of Present Illness: Patient is seen and examined.  Patient is a 50 year old female with a past psychiatric history significant for polysubstance use disorders, substance-induced mood disorder and suicidal ideation.  The patient presented to the [facility] emergency department on 07/02/2020 with suicidal ideation.  The patient stated that she had been homeless for the last 2 months, had been using fentanyl, heroin, crack and methamphetamines.  She was requesting detox.  She also reported feeling suicidal.  She had a plan to jump off a bridge.  She had been seen earlier this month on 06/09/2020.  These were similar circumstances at that time.  She was felt not to require inpatient psychiatric hospitalization and was given resources for homeless shelters as well as the [facility] 14-day residential program.  She stated that she had been at the 14-day program recently, and then released.  She stated it seemed as though she had been there for 2 months.  She stated that they did not give her any resources for her homelessness after she left.  She stated that she had used fentanyl 3 days in a row prior to admission.  Review of her admission laboratories revealed a blood alcohol less than 10, drug screen positive for amphetamines, and a drug screen positive for cocaine.  There were no opiates in her system.  She was admitted to the hospital for evaluation and stabilization.  Associated Signs/Symptoms: Depression Symptoms:  depressed mood, anhedonia, psychomotor agitation, fatigue, feelings of  worthlessness/guilt, difficulty concentrating, hopelessness, suicidal thoughts without plan, anxiety, panic attacks, loss of energy/fatigue, disturbed sleep, Duration of Depression Symptoms: Greater than two weeks  (Hypo) Manic Symptoms:  Impulsivity, Irritable Mood, Labiality of Mood, Anxiety Symptoms:  Excessive Worry, Panic Symptoms, Psychotic Symptoms:   Denied PTSD Symptoms: In the past  Total Time spent with patient: 30 minutes  Past Psychiatric History: The patient's last psychiatric hospitalization to our facility was in September 2021.  She was admitted with a history of polysubstance use disorders.  She had been admitted also in 2021 in June.  Again at that time it was also secondary to substance related disorders.  Shortly after the September admission she presented to the [facility] and was hospitalized there for 4 days.  Most recently she presented to the [facility] emergency department and was there for 31 hours, and was referred to outpatient resources, and it does appear that she went to [place] for 14 days prior to coming back to our facility.  Is the patient at risk to self? Yes.    Has the patient been a risk to self in the past 6 months? Yes.    Has the patient been a risk to self within the distant past? Yes.    Is the patient a risk to others? No.  Has the patient been a risk to others in the past 6 months? No.  Has the patient been a risk to others within the distant past? No.   Prior Inpatient Therapy:   Prior Outpatient Therapy:    Alcohol Screening: 1. How often do you have a drink containing alcohol?: 4 or more times a week 2. How many  drinks containing alcohol do you have on a typical day when you are drinking?: 5 or 6 3. How often do you have six or more drinks on one occasion?: Weekly AUDIT-C Score: 9 4. How often during the last year have you found that you were not able to stop drinking once you had started?:  Weekly 5. How often during the last year have you failed to do what was normally expected from you because of drinking?: Weekly 6. How often during the last year have you needed a first drink in the morning to get yourself going after a heavy drinking session?: Weekly 7. How often during the last year have you had a feeling of guilt of remorse after drinking?: Weekly 8. How often during the last year have you been unable to remember what happened the night before because you had been drinking?: Weekly 9. Have you or someone else been injured as a result of your drinking?: Yes, but not in the last year 10. Has a relative or friend or a doctor or another health worker been concerned about your drinking or suggested you cut down?: Yes, during the last year Alcohol Use Disorder Identification Test Final Score (AUDIT): 30 Alcohol Brief Interventions/Follow-up: Alcohol education/Brief advice Substance Abuse History in the last 12 months:  Yes.   Consequences of Substance Abuse: Clearly contributed to this admission Previous Psychotropic Medications: Yes  Psychological Evaluations: Yes  Past Medical History:  Past Medical History:  Diagnosis Date   Depression    Diverticulitis    Seizures (HCC)     Past Surgical History:  Procedure Laterality Date   COLOSTOMY REVERSAL     Family History: History reviewed. No pertinent family history. Family Psychiatric  History: Noncontributory Tobacco Screening: Have you used any form of tobacco in the last 30 days? (Cigarettes, Smokeless Tobacco, Cigars, and/or Pipes): Yes Tobacco use, Select all that apply: 5 or more cigarettes per day Are you interested in Tobacco Cessation Medications?: Yes, will notify MD for an order Counseled patient on smoking cessation including recognizing danger situations, developing coping skills and basic information about quitting provided: Refused/Declined practical counseling Social History:  Social History   Substance  and Sexual Activity  Alcohol Use Not Currently   Comment: last drink was 2/5th of liquor, been drinking "1 pint or more every day for a couple of weeks now"      Social History   Substance and Sexual Activity  Drug Use Not Currently   Types: Other-see comments, "Crack" cocaine, Heroin, Fentanyl   Comment: "kratom," pt said it's synthetic heroin    Additional Social History:                           Allergies:  No Known Allergies Lab Results:  Results for orders placed or performed during the hospital encounter of 07/02/20 (from the past 48 hour(s))  Comprehensive metabolic panel     Status: Abnormal   Collection Time: 07/02/20  6:46 PM  Result Value Ref Range   Sodium 138 135 - 145 mmol/L   Potassium 3.4 (L) 3.5 - 5.1 mmol/L   Chloride 104 98 - 111 mmol/L   CO2 27 22 - 32 mmol/L   Glucose, Bld 109 (H) 70 - 99 mg/dL    Comment: Glucose reference range applies only to samples taken after fasting for at least 8 hours.   BUN 8 6 - 20 mg/dL   Creatinine, Ser 1.61 (H) 0.44 - 1.00  mg/dL   Calcium 8.8 (L) 8.9 - 10.3 mg/dL   Total Protein 7.1 6.5 - 8.1 g/dL   Albumin 3.4 (L) 3.5 - 5.0 g/dL   AST 29 15 - 41 U/L   ALT 18 0 - 44 U/L   Alkaline Phosphatase 105 38 - 126 U/L   Total Bilirubin 0.5 0.3 - 1.2 mg/dL   GFR, Estimated >82 >95 mL/min    Comment: (NOTE) Calculated using the CKD-EPI Creatinine Equation (2021)    Anion gap 7 5 - 15    Comment: Performed at [facility], [address] [place], [place] [address]  CBC with Differential     Status: Abnormal   Collection Time: 07/02/20  6:46 PM  Result Value Ref Range   WBC 12.8 (H) 4.0 - 10.5 K/uL   RBC 4.44 3.87 - 5.11 MIL/uL   Hemoglobin 14.6 12.0 - 15.0 g/dL   HCT 86.5 78.4 - 69.6 %   MCV 100.7 (H) 80.0 - 100.0 fL   MCH 32.9 26.0 - 34.0 pg   MCHC 32.7 30.0 - 36.0 g/dL   RDW 29.5 28.4 - 13.2 %   Platelets 308 150 - 400 K/uL   nRBC 0.0 0.0 - 0.2 %   Neutrophils Relative % 75 %   Neutro Abs 9.5 (H) 1.7 -  7.7 K/uL   Lymphocytes Relative 17 %   Lymphs Abs 2.2 0.7 - 4.0 K/uL   Monocytes Relative 5 %   Monocytes Absolute 0.7 0.1 - 1.0 K/uL   Eosinophils Relative 2 %   Eosinophils Absolute 0.2 0.0 - 0.5 K/uL   Basophils Relative 0 %   Basophils Absolute 0.1 0.0 - 0.1 K/uL   Immature Granulocytes 1 %   Abs Immature Granulocytes 0.10 (H) 0.00 - 0.07 K/uL    Comment: Performed at [facility], [address] [place], [place] [address]  Salicylate level     Status: Abnormal   Collection Time: 07/02/20  6:46 PM  Result Value Ref Range   Salicylate Lvl <7.0 (L) 7.0 - 30.0 mg/dL    Comment: Performed at [facility], [address], [place], [place] [address]  Ethanol     Status: None   Collection Time: 07/02/20  6:46 PM  Result Value Ref Range   Alcohol, Ethyl (B) <10 <10 mg/dL    Comment: (NOTE) Lowest detectable limit for serum alcohol is 10 mg/dL.  For medical purposes only. Performed at [facility], [address] [place], [place] [address]   Acetaminophen level     Status: Abnormal   Collection Time: 07/02/20  6:46 PM  Result Value Ref Range   Acetaminophen (Tylenol), Serum <10 (L) 10 - 30 ug/mL    Comment: (NOTE) Therapeutic concentrations vary significantly. A range of 10-30 ug/mL  may be an effective concentration for many patients. However, some  are best treated at concentrations outside of this range. Acetaminophen concentrations >150 ug/mL at 4 hours after ingestion  and >50 ug/mL at 12 hours after ingestion are often associated with  toxic reactions.  Performed at [facility], [address], [place], [place] [address]   I-Stat beta hCG blood, ED     Status: Abnormal   Collection Time: 07/02/20  7:29 PM  Result Value Ref Range   I-stat hCG, quantitative 17.6 (H) <5 mIU/mL   Comment 3            Comment:   GEST. AGE  CONC.  (mIU/mL)   <=1 WEEK        5 - 50     2 WEEKS       50 - 500     3 WEEKS       100 - 10,000     4 WEEKS      1,000 - 30,000        FEMALE AND NON-PREGNANT FEMALE:     LESS THAN 5 mIU/mL   Urinalysis, Routine w reflex microscopic Urine, Clean Catch     Status: Abnormal   Collection Time: 07/02/20  7:43 PM  Result Value Ref Range   Color, Urine YELLOW YELLOW   APPearance HAZY (A) CLEAR   Specific Gravity, Urine 1.024 1.005 - 1.030   pH 5.0 5.0 - 8.0   Glucose, UA NEGATIVE NEGATIVE mg/dL   Hgb urine dipstick SMALL (A) NEGATIVE   Bilirubin Urine NEGATIVE NEGATIVE   Ketones, ur NEGATIVE NEGATIVE mg/dL   Protein, ur NEGATIVE NEGATIVE mg/dL   Nitrite NEGATIVE NEGATIVE   Leukocytes,Ua SMALL (A) NEGATIVE   RBC / HPF 6-10 0 - 5 RBC/hpf   WBC, UA 6-10 0 - 5 WBC/hpf   Bacteria, UA NONE SEEN NONE SEEN   Squamous Epithelial / LPF 0-5 0 - 5   Mucus PRESENT     Comment: Performed at [facility], [address], [place], [place] [address]  Pregnancy, urine     Status: None   Collection Time: 07/02/20  8:23 PM  Result Value Ref Range   Preg Test, Ur NEGATIVE NEGATIVE    Comment:        THE SENSITIVITY OF THIS METHODOLOGY IS >20 mIU/mL. Performed at [facility], [address] [place], [place] [address]   GC/Chlamydia probe amp     Status: Abnormal   Collection Time: 07/02/20  9:44 PM  Result Value Ref Range   Neisseria Gonorrhea Positive (A)    Chlamydia Negative    Comment Normal Reference Ranger Chlamydia - Negative    Comment      Normal Reference Range Neisseria Gonorrhea - Negative  RPR     Status: None   Collection Time: 07/02/20  9:44 PM  Result Value Ref Range   RPR Ser Ql NON REACTIVE NON REACTIVE    Comment: Performed at [facility], [address], [place], [place] [address]  HIV Antibody (routine testing w rflx)     Status: None   Collection Time: 07/02/20  9:44 PM  Result Value Ref Range   HIV Screen 4th Generation wRfx Non Reactive Non Reactive    Comment: Performed at [facility], [address], [place], [place] [address]  Resp Panel by RT-PCR  (Flu A&B, Covid) Nasopharyngeal Swab     Status: None   Collection Time: 07/03/20 12:04 AM   Specimen: Nasopharyngeal Swab; Nasopharyngeal(NP) swabs in vial transport medium  Result Value Ref Range   SARS Coronavirus 2 by RT PCR NEGATIVE NEGATIVE    Comment: (NOTE) SARS-CoV-2 target nucleic acids are NOT DETECTED.  The SARS-CoV-2 RNA is generally detectable in upper respiratory specimens during the acute phase of infection. The lowest concentration of SARS-CoV-2 viral copies this assay can detect is 138 copies/mL. A negative result does not preclude SARS-Cov-2 infection and should not be used as the sole basis for treatment or other patient management decisions. A negative result may occur with  improper specimen collection/handling, submission of specimen other than nasopharyngeal swab, presence  of viral mutation(s) within the areas targeted by this assay, and inadequate number of viral copies(<138 copies/mL). A negative result must be combined with clinical observations, patient history, and epidemiological information. The expected result is Negative.  Fact Sheet for Patients:  BloggerCourse.com  Fact Sheet for Healthcare Providers:  SeriousBroker.it  This test is no t yet approved or cleared by the [place] FDA and  has been authorized for detection and/or diagnosis of SARS-CoV-2 by FDA under an Emergency Use Authorization (EUA). This EUA will remain  in effect (meaning this test can be used) for the duration of the COVID-19 declaration under Section 564(b)(1) of the Act, 21 U.S.C.section 360bbb-3(b)(1), unless the authorization is terminated  or revoked sooner.       Influenza A by PCR NEGATIVE NEGATIVE   Influenza B by PCR NEGATIVE NEGATIVE    Comment: (NOTE) The Xpert Xpress SARS-CoV-2/FLU/RSV plus assay is intended as an aid in the diagnosis of influenza from Nasopharyngeal swab specimens and should not be used as  a sole basis for treatment. Nasal washings and aspirates are unacceptable for Xpert Xpress SARS-CoV-2/FLU/RSV testing.  Fact Sheet for Patients: BloggerCourse.com  Fact Sheet for Healthcare Providers: SeriousBroker.it  This test is not yet approved or cleared by the [place] FDA and has been authorized for detection and/or diagnosis of SARS-CoV-2 by FDA under an Emergency Use Authorization (EUA). This EUA will remain in effect (meaning this test can be used) for the duration of the COVID-19 declaration under Section 564(b)(1) of the Act, 21 U.S.C. section 360bbb-3(b)(1), unless the authorization is terminated or revoked.  Performed at [facility], [address] [place], [place] [address]     Blood Alcohol level:  Lab Results  Component Value Date   ETH <10 07/02/2020   ETH <10 06/09/2020    Metabolic Disorder Labs:  Lab Results  Component Value Date   HGBA1C 5.3 02/21/2020   MPG 105 02/21/2020   MPG 105.41 06/12/2019   No results found for: PROLACTIN Lab Results  Component Value Date   CHOL 126 06/12/2019   TRIG 219 (H) 06/12/2019   HDL 46 06/12/2019   CHOLHDL 2.7 06/12/2019   VLDL 44 (H) 06/12/2019   LDLCALC 36 06/12/2019    Current Medications: Current Facility-Administered Medications  Medication Dose Route Frequency Provider Last Rate Last Admin   acetaminophen (TYLENOL) tablet 650 mg  650 mg Oral Q6H PRN Antonieta Pert, MD   650 mg at 07/04/20 7253   alum & mag hydroxide-simeth (MAALOX/MYLANTA) 200-200-20 MG/5ML suspension 30 mL  30 mL Oral Q4H PRN Antonieta Pert, MD       cefTRIAXone (ROCEPHIN) injection 500 mg  500 mg Intramuscular Once Antonieta Pert, MD       citalopram (CELEXA) tablet 40 mg  40 mg Oral Daily Antonieta Pert, MD   40 mg at 07/04/20 6644   doxycycline (VIBRA-TABS) tablet 100 mg  100 mg Oral Q12H Antonieta Pert, MD       folic acid (FOLVITE) tablet 1 mg  1  mg Oral Daily Antonieta Pert, MD       gabapentin (NEURONTIN) capsule 400 mg  400 mg Oral TID Antonieta Pert, MD   400 mg at 07/04/20 0949   hydrOXYzine (ATARAX/VISTARIL) tablet 25 mg  25 mg Oral Q6H PRN Antonieta Pert, MD   25 mg at 07/04/20 0347   loperamide (IMODIUM) capsule 2-4 mg  2-4 mg Oral PRN Antonieta Pert, MD  LORazepam (ATIVAN) tablet 1 mg  1 mg Oral Q6H PRN Antonieta Pert, MD   1 mg at 07/04/20 2956   magnesium hydroxide (MILK OF MAGNESIA) suspension 30 mL  30 mL Oral Daily PRN Antonieta Pert, MD       metroNIDAZOLE (FLAGYL) tablet 500 mg  500 mg Oral Q12H Jola Babinski Marlane Mingle, MD       multivitamin with minerals tablet 1 tablet  1 tablet Oral Daily Antonieta Pert, MD   1 tablet at 07/04/20 2130   nicotine (NICODERM CQ - dosed in mg/24 hours) patch 21 mg  21 mg Transdermal Daily Antonieta Pert, MD   21 mg at 07/04/20 0949   ondansetron (ZOFRAN-ODT) disintegrating tablet 4 mg  4 mg Oral Q6H PRN Antonieta Pert, MD   4 mg at 07/04/20 8657   thiamine tablet 100 mg  100 mg Oral Daily Antonieta Pert, MD   100 mg at 07/04/20 8469   PTA Medications: Medications Prior to Admission  Medication Sig Dispense Refill Last Dose   citalopram (CELEXA) 40 MG tablet Take 1 tablet (40 mg total) by mouth daily. (Patient not taking: Reported on 07/02/2020) 30 tablet 2    gabapentin (NEURONTIN) 400 MG capsule TAKE 1 CAPSULE(400 MG) BY MOUTH THREE TIMES DAILY (Patient not taking: Reported on 07/02/2020) 90 capsule 2     Musculoskeletal: Strength & Muscle Tone: within normal limits Gait & Station: shuffle Patient leans: N/A            Psychiatric Specialty Exam:  Presentation  General Appearance: Disheveled  Eye Contact:Minimal  Speech:Normal Rate  Speech Volume:Decreased  Handedness:Right   Mood and Affect  Mood:Depressed; Dysphoric  Affect:Flat   Thought Process  Thought Processes:Goal Directed  Duration of Psychotic Symptoms: No  data recorded Past Diagnosis of Schizophrenia or Psychoactive disorder: No  Descriptions of Associations:Circumstantial  Orientation:Full (Time, Place and Person)  Thought Content:Logical  Hallucinations:Hallucinations: None  Ideas of Reference:None  Suicidal Thoughts:Suicidal Thoughts: Yes, Passive SI Passive Intent and/or Plan: Without Intent  Homicidal Thoughts:Homicidal Thoughts: No   Sensorium  Memory:Immediate Poor; Recent Poor; Remote Poor  Judgment:Impaired  Insight:Lacking   Executive Functions  Concentration:Fair  Attention Span:Fair  Recall:Poor  Fund of Knowledge:Fair  Language:Fair   Psychomotor Activity  Psychomotor Activity:Psychomotor Activity: Decreased   Assets  Assets:Desire for Improvement; Resilience   Sleep  Sleep:Sleep: Good Number of Hours of Sleep: 6.75    Physical Exam: Physical Exam Vitals and nursing note reviewed.  HENT:     Head: Normocephalic.  Pulmonary:     Effort: Pulmonary effort is normal.  Neurological:     General: No focal deficit present.     Mental Status: She is alert and oriented to person, place, and time.   Review of Systems  Musculoskeletal:  Positive for back pain, joint pain and myalgias.  All other systems reviewed and are negative. Blood pressure 110/72, pulse 79, temperature 97.7 F (36.5 C), temperature source Oral, resp. rate 16, height  (1.549 m), weight 73 kg, last menstrual period 08/22/2019, SpO2 100 %. Body mass index is 30.42 kg/m.  Treatment Plan Summary: Patient is seen and examined.  Patient is a 50 year old female with the above-stated past psychiatric history who was admitted secondary to requesting detoxification from polysubstances, suicidal ideation as well as probable substance-induced mood disorder.  She will be admitted to the hospital.  She will be integrated in the milieu.  She will be encouraged to attend groups.  She was  admitted on the opiate detox protocol, and I  went on and ordered lorazepam 1 mg p.o. every 6 hours as needed a CIWA greater than 10 because of her alcohol history.  She also stated that she had not been taking the Celexa which she had been prescribed in the past.  She also stated she had not been taking the Neurontin she had been prescribed in the past.  She denied having seen a psychiatrist recently, and was unsure who was writing her prescriptions.  Review of the PDMP database revealed her last controlled substance prescription was on 09/01/2019 for 10 hydrocodone tablets.  Her last methylphenidate prescription was also on 02/14/2019.  Her actual last inpatient admission was on 09/17/2019.  The history of present illness sounds very similar to the current.  Review of her admission laboratories revealed a mildly low potassium at 3.4, but otherwise normal creatinine and normal liver function enzymes.  Her CBC was essentially normal except for an elevated MCV of 100.7.  Differential was essentially normal.  Her acetaminophen was less than 10, salicylate less than 7.  Pregnancy test was negative.  Her chlamydia was negative, but gonorrhea was positive.  RPR was negative.  Respiratory panel for influenza A, B and coronavirus were all negative.  Her HIV was negative.  Urinalysis showed no bacteria, but 6-10 white blood cells.  Again her blood alcohol was less than 10, drug screen was positive for amphetamines and cocaine.  She had a CT scan of the head and facial bones done in the emergency room which revealed no acute intracranial abnormality.  No acute maxillofacial fracture.  There was mild soft tissue swelling in the left temporal area.  From review of the electronic medical record it does not appear as though she received treatment for the positive gonorrhea test.  According to the electronic medical record she does not have any allergies to medications.  We will give her ceftriaxone 500 mg IM times 1+ doxycycline 100 mg p.o. twice daily for 14 days as well as  Flagyl 500 mg p.o. twice daily for 14 days.  Given her elevated MCV we will also place her on folic acid as well as thiamine.  Currently her vital signs are stable, she is afebrile.  Her pulse oximetry on room air is 100%.  Her CIWA this a.m. was 13.  She did sleep 6.75 hours last night.  She also continues on the components of the opiate detox protocol.  Social work will need to be involved in the case given her homelessness at this point.  Observation Level/Precautions:  15 minute checks Seizure  Laboratory:  CBC Chemistry Profile HbAIC UDS  Psychotherapy:    Medications:    Consultations:    Discharge Concerns:    Estimated LOS:  Other:     Physician Treatment Plan for Primary Diagnosis: <principal problem not specified> Long Term Goal(s): Improvement in symptoms so as ready for discharge  Short Term Goals: Ability to identify changes in lifestyle to reduce recurrence of condition will improve, Ability to verbalize feelings will improve, Ability to disclose and discuss suicidal ideas, Ability to demonstrate self-control will improve, Ability to identify and develop effective coping behaviors will improve, Ability to maintain clinical measurements within normal limits will improve, Compliance with prescribed medications will improve, and Ability to identify triggers associated with substance abuse/mental health issues will improve  Physician Treatment Plan for Secondary Diagnosis: Active Problems:   MDD (major depressive disorder), recurrent severe, without psychosis (HCC)  Long Term Goal(s):  Improvement in symptoms so as ready for discharge  Short Term Goals: Ability to identify changes in lifestyle to reduce recurrence of condition will improve, Ability to verbalize feelings will improve, Ability to disclose and discuss suicidal ideas, Ability to demonstrate self-control will improve, Ability to identify and develop effective coping behaviors will improve, Ability to maintain clinical  measurements within normal limits will improve, Compliance with prescribed medications will improve, and Ability to identify triggers associated with substance abuse/mental health issues will improve  I certify that inpatient services furnished can reasonably be expected to improve the patient's condition.    Antonieta PertGreg Lawson Sharifa Bucholz, MD 6/29/202212:50 PM

## 2020-07-04 NOTE — Progress Notes (Signed)
   07/03/20 2100  Psych Admission Type (Psych Patients Only)  Admission Status Voluntary  Psychosocial Assessment  Patient Complaints Substance abuse;Anxiety  Eye Contact Avoids  Facial Expression Sad  Affect Depressed  Speech Soft;Slow  Interaction Minimal  Motor Activity Slow  Appearance/Hygiene Disheveled;In scrubs  Behavior Characteristics Cooperative  Mood Anxious;Depressed  Thought Process  Coherency WDL  Content WDL  Delusions WDL  Perception WDL  Hallucination None reported or observed  Judgment Impaired  Confusion WDL  Danger to Self  Current suicidal ideation? Denies  Danger to Others  Danger to Others None reported or observed

## 2020-07-04 NOTE — Progress Notes (Signed)
   07/04/20 0945  Psych Admission Type (Psych Patients Only)  Admission Status Voluntary  Psychosocial Assessment  Patient Complaints Anxiety;Decreased concentration;Depression;Irritability;Shakiness;Tension;Worthlessness;Worrying  Eye Contact Avoids  Facial Expression Sad  Affect Depressed  Speech Soft;Slow  Interaction Minimal  Motor Activity Slow  Appearance/Hygiene Disheveled;In scrubs  Behavior Characteristics Anxious  Mood Depressed;Sad  Thought Process  Coherency WDL  Content WDL  Delusions WDL  Perception WDL  Hallucination None reported or observed  Judgment Impaired  Confusion WDL  Danger to Self  Current suicidal ideation? Denies  Danger to Others  Danger to Others None reported or observed

## 2020-07-04 NOTE — Tx Team (Signed)
Interdisciplinary Treatment and Diagnostic Plan Update  07/04/2020 Time of Session: 9:15am  Tina Torres MRN: [id number]  Principal Diagnosis: <principal problem not specified>  Secondary Diagnoses: Active Problems:   MDD (major depressive disorder), recurrent severe, without psychosis ([place])   Current Medications:  Current Facility-Administered Medications  Medication Dose Route Frequency Provider Last Rate Last Admin   acetaminophen (TYLENOL) tablet 650 mg  650 mg Oral Q6H PRN Sharma Covert, MD   650 mg at 07/04/20 3762   alum & mag hydroxide-simeth (MAALOX/MYLANTA) 200-200-20 MG/5ML suspension 30 mL  30 mL Oral Q4H PRN Sharma Covert, MD       cefTRIAXone (ROCEPHIN) injection 500 mg  500 mg Intramuscular Once Sharma Covert, MD       citalopram (CELEXA) tablet 40 mg  40 mg Oral Daily Sharma Covert, MD   40 mg at 07/04/20 8315   doxycycline (VIBRA-TABS) tablet 100 mg  100 mg Oral Q12H Sharma Covert, MD       folic acid (FOLVITE) tablet 1 mg  1 mg Oral Daily Sharma Covert, MD       gabapentin (NEURONTIN) capsule 400 mg  400 mg Oral TID Sharma Covert, MD   400 mg at 07/04/20 0949   hydrOXYzine (ATARAX/VISTARIL) tablet 25 mg  25 mg Oral Q6H PRN Sharma Covert, MD   25 mg at 07/04/20 1761   loperamide (IMODIUM) capsule 2-4 mg  2-4 mg Oral PRN Sharma Covert, MD       LORazepam (ATIVAN) tablet 1 mg  1 mg Oral Q6H PRN Sharma Covert, MD   1 mg at 07/04/20 6073   magnesium hydroxide (MILK OF MAGNESIA) suspension 30 mL  30 mL Oral Daily PRN Sharma Covert, MD       metroNIDAZOLE (FLAGYL) tablet 500 mg  500 mg Oral Q12H Mallie Darting Cordie Grice, MD       multivitamin with minerals tablet 1 tablet  1 tablet Oral Daily Sharma Covert, MD   1 tablet at 07/04/20 0953   nicotine (NICODERM CQ - dosed in mg/24 hours) patch 21 mg  21 mg Transdermal Daily Sharma Covert, MD   21 mg at 07/04/20 0949   ondansetron (ZOFRAN-ODT) disintegrating tablet 4  mg  4 mg Oral Q6H PRN Sharma Covert, MD   4 mg at 07/04/20 7106   thiamine tablet 100 mg  100 mg Oral Daily Sharma Covert, MD   100 mg at 07/04/20 2694   PTA Medications: Medications Prior to Admission  Medication Sig Dispense Refill Last Dose   citalopram (CELEXA) 40 MG tablet Take 1 tablet (40 mg total) by mouth daily. (Patient not taking: Reported on 07/02/2020) 30 tablet 2    gabapentin (NEURONTIN) 400 MG capsule TAKE 1 CAPSULE(400 MG) BY MOUTH THREE TIMES DAILY (Patient not taking: Reported on 07/02/2020) 90 capsule 2     Patient Stressors: Financial difficulties Health problems Medication change or noncompliance Substance abuse  Patient Strengths: General fund of knowledge Physical Health  Treatment Modalities: Medication Management, Group therapy, Case management,  1 to 1 session with clinician, Psychoeducation, Recreational therapy.   Physician Treatment Plan for Primary Diagnosis: <principal problem not specified> Long Term Goal(s):     Short Term Goals:    Medication Management: Evaluate patient's response, side effects, and tolerance of medication regimen.  Therapeutic Interventions: 1 to 1 sessions, Unit Group sessions and Medication administration.  Evaluation of Outcomes: Not Met  Physician Treatment Plan for  Secondary Diagnosis: Active Problems:   MDD (major depressive disorder), recurrent severe, without psychosis ([place])  Long Term Goal(s):     Short Term Goals:       Medication Management: Evaluate patient's response, side effects, and tolerance of medication regimen.  Therapeutic Interventions: 1 to 1 sessions, Unit Group sessions and Medication administration.  Evaluation of Outcomes: Not Met   RN Treatment Plan for Primary Diagnosis: <principal problem not specified> Long Term Goal(s): Knowledge of disease and therapeutic regimen to maintain health will improve  Short Term Goals: Ability to remain free from injury will improve, Ability to  participate in decision making will improve, Ability to verbalize feelings will improve, Ability to disclose and discuss suicidal ideas, and Ability to identify and develop effective coping behaviors will improve  Medication Management: RN will administer medications as ordered by provider, will assess and evaluate patient's response and provide education to patient for prescribed medication. RN will report any adverse and/or side effects to prescribing provider.  Therapeutic Interventions: 1 on 1 counseling sessions, Psychoeducation, Medication administration, Evaluate responses to treatment, Monitor vital signs and CBGs as ordered, Perform/monitor CIWA, COWS, AIMS and Fall Risk screenings as ordered, Perform wound care treatments as ordered.  Evaluation of Outcomes: Not Met   LCSW Treatment Plan for Primary Diagnosis: <principal problem not specified> Long Term Goal(s): Safe transition to appropriate next level of care at discharge, Engage patient in therapeutic group addressing interpersonal concerns.  Short Term Goals: Engage patient in aftercare planning with referrals and resources, Increase social support, Facilitate acceptance of mental health diagnosis and concerns, Facilitate patient progression through stages of change regarding substance use diagnoses and concerns, Identify triggers associated with mental health/substance abuse issues, and Increase skills for wellness and recovery  Therapeutic Interventions: Assess for all discharge needs, 1 to 1 time with Social worker, Explore available resources and support systems, Assess for adequacy in community support network, Educate family and significant other(s) on suicide prevention, Complete Psychosocial Assessment, Interpersonal group therapy.  Evaluation of Outcomes: Not Met   Progress in Treatment: Attending groups: No. Participating in groups: No. Taking medication as prescribed: Yes. Toleration medication:  Yes. Family/Significant other contact made: Yes, individual(s) contacted:  If consents are provided  Patient understands diagnosis: No. Discussing patient identified problems/goals with staff: Yes. Medical problems stabilized or resolved: Yes. Denies suicidal/homicidal ideation: Yes. Issues/concerns per patient self-inventory: No.   New problem(s) identified: No, Describe:  None   New Short Term/Long Term Goal(s): medication stabilization, elimination of SI thoughts, development of comprehensive mental wellness plan.   Patient Goals: Did not attend   Discharge Plan or Barriers: Patient recently admitted. CSW will continue to follow and assess for appropriate referrals and possible discharge planning.   Reason for Continuation of Hospitalization: Depression Medication stabilization Suicidal ideation Withdrawal symptoms  Estimated Length of Stay: 3 to 5 days   Attendees: Patient: Did not attend  07/04/2020   Physician: Myles Lipps, MD 07/04/2020   Nursing:  07/04/2020   RN Care Manager: 07/04/2020   Social Worker: Verdis Frederickson, [place] 07/04/2020   Recreational Therapist:  07/04/2020   Other:  07/04/2020   Other:  07/04/2020   Other: 07/04/2020     Scribe for Treatment Team: Darleen Crocker, [place] 07/04/2020 11:02 AM

## 2020-07-05 DIAGNOSIS — F1910 Other psychoactive substance abuse, uncomplicated: Secondary | ICD-10-CM

## 2020-07-05 NOTE — Progress Notes (Signed)
[facility] MD Progress Note  07/05/2020 4:48 PM Tina Torres  MRN:  [id number]  Reason for admission: Per H & P:  Tina Torres is a 50 year old female with a past psychiatric history significant for polysubstance use disorders, substance-induced mood disorder and suicidal ideation.  The patient presented to the [facility] emergency department on 07/02/2020 with suicidal ideation., stating that she had been homeless for the last 2 months, had been using fentanyl, heroin, crack and methamphetamines. Patient reportedly requesting detox and endorsed suicidal ideations. She had a plan to jump off a bridge.  She had been seen earlier this month on 06/09/2020.  These were similar circumstances at that time.  Subjective/Evaluation on the unit: Chart reviewed and patient discussed in team rounds. Patient was approached several times while she was sleeping in her room. Patient agreed to answer a few questions about 1615 today, but answered with mumbles and limited eye contact.  She states that she "would rather die than feel this bad." She says "I am detoxing." She expresses disappointment that we are not giving her Suboxone. Patient denies homicidal ideations. She does not appear to be responding to stimuli. She does look uncomfortable and asked for Ativan, which RN gave. Patient having poor po intake. Encouraged patient to drink fluids at least.  Will continue CIWA protocol. Encourage fluids and milieu participation as tolerated.  No changes to medication regimen.     Principal Problem: Polysubstance abuse (HCC) Diagnosis: Principal Problem:   Polysubstance abuse (HCC) Active Problems:   MDD (major depressive disorder), recurrent severe, without psychosis (HCC)  Total Time spent with patient: 15 minutes  Past Psychiatric History: Per H & P: "The patient's last psychiatric hospitalization to our facility was in September 2021.  She was admitted with a history of polysubstance use disorders.   She had been admitted also in 2021 in June.  Again at that time it was also secondary to substance related disorders.  Shortly after the September admission she presented to the [facility] and was hospitalized there for 4 days.  Most recently she presented to the [facility] emergency department and was there for 31 hours, and was referred to outpatient resources, and it does appear that she went to [place] for 14 days prior to coming back to our facility."  Past Medical History:  Past Medical History:  Diagnosis Date   Depression    Diverticulitis    Seizures (HCC)     Past Surgical History:  Procedure Laterality Date   COLOSTOMY REVERSAL     Family History: History reviewed. No pertinent family history.  Family Psychiatric  History: Per H  & P: Noncontributory   Social History:  Social History   Substance and Sexual Activity  Alcohol Use Not Currently   Comment: last drink was 2/5th of liquor, been drinking "1 pint or more every day for a couple of weeks now"      Social History   Substance and Sexual Activity  Drug Use Not Currently   Types: Other-see comments, "Crack" cocaine, Heroin, Fentanyl   Comment: "kratom," pt said it's synthetic heroin    Social History   Socioeconomic History   Marital status: Unknown    Spouse name: Not on file   Number of children: Not on file   Years of education: Not on file   Highest education level: Not on file  Occupational History   Not on file  Tobacco Use   Smoking status: Every  Day    Packs/day: 1.00    Pack years: 0.00    Types: Cigarettes   Smokeless tobacco: Never  Vaping Use   Vaping Use: Never used  Substance and Sexual Activity   Alcohol use: Not Currently    Comment: last drink was 2/5th of liquor, been drinking "1 pint or more every day for a couple of weeks now"    Drug use: Not Currently    Types: Other-see comments, "Crack" cocaine, Heroin, Fentanyl    Comment: "kratom,"  pt said it's synthetic heroin   Sexual activity: Not Currently  Other Topics Concern   Not on file  Social History Narrative   ** Merged History Encounter **       Social Determinants of Health   Financial Resource Strain: Not on file  Food Insecurity: Not on file  Transportation Needs: Not on file  Physical Activity: Not on file  Stress: Not on file  Social Connections: Not on file   Additional Social History:      Sleep: Good  Appetite:  Poor  Current Medications: Current Facility-Administered Medications  Medication Dose Route Frequency Provider Last Rate Last Admin   acetaminophen (TYLENOL) tablet 650 mg  650 mg Oral Q6H PRN Antonieta Pertlary, Greg Lawson, MD   650 mg at 07/04/20 2043   alum & mag hydroxide-simeth (MAALOX/MYLANTA) 200-200-20 MG/5ML suspension 30 mL  30 mL Oral Q4H PRN Antonieta Pertlary, Greg Lawson, MD       citalopram (CELEXA) tablet 40 mg  40 mg Oral Daily Antonieta Pertlary, Greg Lawson, MD   40 mg at 07/05/20 0944   doxycycline (VIBRA-TABS) tablet 100 mg  100 mg Oral Q12H Antonieta Pertlary, Greg Lawson, MD   100 mg at 07/05/20 0944   folic acid (FOLVITE) tablet 1 mg  1 mg Oral Daily Antonieta Pertlary, Greg Lawson, MD   1 mg at 07/05/20 0944   gabapentin (NEURONTIN) capsule 400 mg  400 mg Oral TID Antonieta Pertlary, Greg Lawson, MD   400 mg at 07/05/20 1629   hydrOXYzine (ATARAX/VISTARIL) tablet 25 mg  25 mg Oral Q6H PRN Antonieta Pertlary, Greg Lawson, MD   25 mg at 07/04/20 2043   loperamide (IMODIUM) capsule 2-4 mg  2-4 mg Oral PRN Antonieta Pertlary, Greg Lawson, MD   2 mg at 07/04/20 2043   LORazepam (ATIVAN) tablet 1 mg  1 mg Oral Q6H PRN Antonieta Pertlary, Greg Lawson, MD   1 mg at 07/05/20 1629   magnesium hydroxide (MILK OF MAGNESIA) suspension 30 mL  30 mL Oral Daily PRN Antonieta Pertlary, Greg Lawson, MD       metroNIDAZOLE (FLAGYL) tablet 500 mg  500 mg Oral Q12H Antonieta Pertlary, Greg Lawson, MD   500 mg at 07/05/20 [id number]   multivitamin with minerals tablet 1 tablet  1 tablet Oral Daily Antonieta Pertlary, Greg Lawson, MD   1 tablet at 07/05/20 0945   nicotine (NICODERM CQ - dosed in  mg/24 hours) patch 21 mg  21 mg Transdermal Daily Antonieta Pertlary, Greg Lawson, MD   21 mg at 07/05/20 0943   ondansetron (ZOFRAN-ODT) disintegrating tablet 4 mg  4 mg Oral Q6H PRN Antonieta Pertlary, Greg Lawson, MD   4 mg at 07/04/20 2226   thiamine tablet 100 mg  100 mg Oral Daily Antonieta Pertlary, Greg Lawson, MD   100 mg at 07/05/20 [id number]    Lab Results: No results found for this or any previous visit (from the past 48 hour(s)).  Blood Alcohol level:  Lab Results  Component Value Date   ETH <10 07/02/2020   ETH <10  06/09/2020    Metabolic Disorder Labs: Lab Results  Component Value Date   HGBA1C 5.3 02/21/2020   MPG 105 02/21/2020   MPG 105.41 06/12/2019   No results found for: PROLACTIN Lab Results  Component Value Date   CHOL 126 06/12/2019   TRIG 219 (H) 06/12/2019   HDL 46 06/12/2019   CHOLHDL 2.7 06/12/2019   VLDL 44 (H) 06/12/2019   LDLCALC 36 06/12/2019    Physical Findings: AIMS: Facial and Oral Movements Muscles of Facial Expression: None, normal Lips and Perioral Area: None, normal Jaw: None, normal Tongue: None, normal,Extremity Movements Upper (arms, wrists, hands, fingers): None, normal Lower (legs, knees, ankles, toes): None, normal, Trunk Movements Neck, shoulders, hips: None, normal, Overall Severity Severity of abnormal movements (highest score from questions above): None, normal Incapacitation due to abnormal movements: None, normal Patient's awareness of abnormal movements (rate only patient's report): No Awareness, Dental Status Current problems with teeth and/or dentures?: No Does patient usually wear dentures?: No  CIWA:  CIWA-Ar Total: 10 COWS:  COWS Total Score: 9  Musculoskeletal: Strength & Muscle Tone: within normal limits Gait & Station: normal Patient leans: N/A  Psychiatric Specialty Exam:  Presentation  General Appearance: Disheveled  Eye Contact:Absent  Speech:Garbled  Speech Volume:Decreased  Handedness:Right   Mood and Affect  Mood:Depressed;  Anxious; Irritable  Affect:Blunt; Flat   Thought Process  Thought Processes:Goal Directed  Descriptions of Associations:Intact  Orientation:Full (Time, Place and Person)  Thought Content:WDL  History of Schizophrenia/Schizoaffective disorder:No  Duration of Psychotic Symptoms:No data recorded Hallucinations:Hallucinations: None (Denies)  Ideas of Reference:None (Denies)  Suicidal Thoughts:Suicidal Thoughts: Yes, Passive SI Passive Intent and/or Plan: Without Intent; Without Plan  Homicidal Thoughts:Homicidal Thoughts: No (Denies)   Sensorium  Memory:Immediate Poor; Recent Poor; Remote Poor  Judgment:Impaired  Insight:Lacking   Executive Functions  Concentration:Fair  Attention Span:Fair  Recall:Poor  Fund of Knowledge:Fair  Language:Fair   Psychomotor Activity  Psychomotor Activity:Psychomotor Activity: Decreased   Assets  Assets:Desire for Improvement; Resilience   Sleep  Sleep:Sleep: Good Number of Hours of Sleep: 6.75    Physical Exam: Physical Exam Vitals reviewed.  HENT:     Head: Normocephalic.     Nose: No congestion or rhinorrhea.  Eyes:     General:        Right eye: No discharge.        Left eye: No discharge.  Pulmonary:     Effort: Pulmonary effort is normal.  Musculoskeletal:        General: Normal range of motion.     Cervical back: Normal range of motion.  Neurological:     Mental Status: She is oriented to person, place, and time.   Review of Systems  Constitutional:        Pt has general aches   Psychiatric/Behavioral:  Positive for depression, substance abuse (hx of) and suicidal ideas (passive). Negative for hallucinations and memory loss. The patient is nervous/anxious. The patient does not have insomnia.   Blood pressure 104/70, pulse (!) 105, temperature 97.8 F (36.6 C), temperature source Oral, resp. rate 16, height 5\' 1"  (1.549 m), weight 73 kg, last menstrual period 08/22/2019, SpO2 96 %. Body mass index is  30.42 kg/m.   Treatment Plan Summary: Daily contact with patient to assess and evaluate symptoms and progress in treatment and Medication management  STI (Gonorrhea) -Rocephin IM 500 mg (Completed 07/04/20) -Continue Doxycycline 100 mg every 12 hours -Continue Flagyl 500 mg every 12 hours x 14 days (from 07/04/2020)  Anxiety -Continue  Gabapentin 400 mg 3 times daily -hydroxyzine 25 mg every 6 hours prn   Depression -Continue Celexa 40 Mg daily  Nausea/vomiting -Continue Zofran 4 mg every 6 hours prn   Smoking cessation/cravings -Continue nicotine patch 21 mg daily  Supplements -Continue Thiamin tab 100 mg daily -Continue multivitamin, 1 daily -Continue Folic acid 1 mg daIly  PRNs, Other  -Continue Tylenol 650 mg po every 6 hrs prn, mild pain -Continue MAALOX/MYLANTA 30 mL po every 4 hrs prn indigestion -Continue Milk of Magnesia 30 mL po daily prn mild constipation   -Continue  Imodium 2-4 mg diarrhea  Opiate use disorder -Continue CIWA protocol  (Ativan 1 mg every 6 hours prn CIWA >10)    Vanetta Mulders, NP, PMHNP-BC 07/05/2020, 4:48 PM

## 2020-07-05 NOTE — Progress Notes (Signed)
   07/05/20 1100  Psych Admission Type (Psych Patients Only)  Admission Status Voluntary  Psychosocial Assessment  Patient Complaints Anxiety;Depression;Shakiness;Worthlessness  Eye Contact Avoids  Facial Expression Flat;Sad  Affect Anxious;Appropriate to circumstance;Depressed;Sad;Flat  Speech Logical/coherent;Soft;Slow  Interaction Forwards little;Guarded;Minimal;Isolative  Motor Activity Slow  Appearance/Hygiene Disheveled;In scrubs  Behavior Characteristics Anxious;Irritable  Mood Anxious;Depressed;Angry  Thought Process  Coherency WDL  Content WDL  Delusions None reported or observed  Perception WDL  Hallucination None reported or observed  Judgment Impaired  Confusion None  Danger to Self  Current suicidal ideation? Denies  Danger to Others  Danger to Others None reported or observed

## 2020-07-05 NOTE — Progress Notes (Signed)
   07/05/20 2227  Psych Admission Type (Psych Patients Only)  Admission Status Voluntary  Psychosocial Assessment  Patient Complaints Anxiety;Depression;Shakiness  Eye Contact Avoids;Brief  Facial Expression Flat;Sad  Affect Anxious;Appropriate to circumstance;Depressed  Speech Logical/coherent;Soft  Interaction Minimal;Guarded  Motor Activity Slow  Appearance/Hygiene Disheveled;In scrubs  Behavior Characteristics Cooperative;Appropriate to situation  Mood Depressed;Anxious  Thought Process  Coherency WDL  Content WDL  Delusions None reported or observed  Perception WDL  Hallucination None reported or observed  Judgment Impaired  Confusion None  Danger to Self  Current suicidal ideation? Denies  Danger to Others  Danger to Others None reported or observed

## 2020-07-05 NOTE — B[facility] Counselor (Signed)
Adult Comprehensive Assessment  Patient ID: Sunset Joshi, female   DOB: 1970-11-06, 50 y.o.   MRN: [id number]  Information Source: Information source: Patient   Current Stressors:  Patient states their primary concerns and needs for treatment are:: "Suicidal thoughts" Patient states their goals for this hospitilization and ongoing recovery are:: "To stop using drugs and get into rehab" Educational / Learning stressors: Pt reports no stressors  Employment / Job issues: Pt reports being unemployed  Family Relationships: Pt reports no family Tour manager / Lack of resources (include bankruptcy): Pt reports no income  Housing / Lack of housing: Pt reports being homeless Physical health (include injuries & life threatening diseases): Pt reports no stressors  Social relationships: Pt reports few social relationships  Substance abuse: Pt reports using Cocaine, Heroin, and Methamphetamines  Bereavement / Loss: Grandmother passed away in 12/11/18   Living/Environment/Situation:  Living Arrangements: Homeless Living conditions (as described by patient or guardian): N/A Who else lives in the home?: No One How long has patient lived in current situation?: 2 months  What is atmosphere in current home: Dangerous and Temporary    Family History:  Marital status: Single  Divorced, when?: "A long time ago" What types of issues is patient dealing with in the relationship?: "We fought a lot" Additional relationship information: None Are you sexually active?: Yes What is your sexual orientation?: Bisexual Has your sexual activity been affected by drugs, alcohol, medication, or emotional stress?: No Does patient have children?: No   Childhood History:  By whom was/is the patient raised?: Both parents Additional childhood history information: "Good" Description of patient's relationship with caregiver when they were a child: "Good" Patient's description of current relationship  with people who raised him/her: No, they are deceased How were you disciplined when you got in trouble as a child/adolescent?: "Spanked" Does patient have siblings?: Yes Number of Siblings: 1 Description of patient's current relationship with siblings: Older sister- "Talk on occasion" Did patient suffer any verbal/emotional/physical/sexual abuse as a child?: No Did patient suffer from severe childhood neglect?: No Has patient ever been sexually abused/assaulted/raped as an adolescent or adult?: No Was the patient ever a victim of a crime or a disaster?: No Witnessed domestic violence?: No Has patient been affected by domestic violence as an adult?: No   Education:  Highest grade of school patient has completed: 12th grade and some college Currently a Consulting civil engineer?: No Learning disability?: No   Employment/Work Situation:   Employment situation: Unemployed Where is patient currently employed?: Pt reports working at [employer] and quitting 2 months ago  How long has patient been employed?: Unknown  Patient's job has been impacted by current illness: Yes Describe how patient's job has been impacted: "I'm not able to work right now, because of my substance use" What is the longest time patient has a held a job?: 7 years Where was the patient employed at that time?: [employer]  Has patient ever been in the [employer]?: No   Financial Resources:   Financial resources: No income  Does patient have a Lawyer or guardian?: No   Alcohol/Substance Abuse:   What has been your use of drugs/alcohol within the last 12 months?: Pt reports using Methamphetamines, Cocaine, and Heroin "I have used everything as often as I can get it" If attempted suicide, did drugs/alcohol play a role in this?: Yes Alcohol/Substance Abuse Treatment Hx: [facility], [facility] in 2021, [facility] in 2022 If yes, describe treatment: "I've been everywhere" Has  alcohol/substance abuse ever caused legal  problems?: Yes (missed court date recently in [facility] "I just didn't go")   Social Support System:   [employer] Support System: None  Describe Community Support System: Self Type of faith/religion: None How does patient's faith help to cope with current illness?: N/A   Leisure/Recreation:   Do You Have Hobbies?: N/A Leisure and Hobbies: Pt did not specify    Strengths/Needs:   What is the patient's perception of their strengths?: Pt did not specify  Patient states they can use these personal strengths during their treatment to contribute to their recovery: Pt did not specify  Patient states these barriers may affect/interfere with their treatment: None Patient states these barriers may affect their return to the community: None Other important information patient would like considered in planning for their treatment: None   Summary/Recommendations:   Summary and Recommendations (to be completed by the evaluator):  Johnika Escareno is a 50 year old, female who was admitted to the hospital due to suicidal thoughts, worsening depression, and substance use.  The Pt states that she has been homeless for the past 2 months and was previously staying at an [employer].  She states "I just left one day and didn't go back".  The Pt reports that her primary stressors is her substance use.  She states "I have used every type of substance and I use them as often as I can get them".  The Pt was unable to report how often or how much of each substance she has used within the past 6 months.  The Pt reports no family contact and no social supports.  The Pt reports having a recent court date that she missed in [facility] stating "I just didn't go to it".  The Pt also reports having no income, no transportation, and no medical insurance.  While at the hospital the Pt can benefit from crisis stabilization, medication evaluation, group therapy, psycho-education, case management, and discharge  planning.  Upon discharge the Pt would like to go to a residential treatment center for substance use and would also like to follow up with an outpatient provider for therapy and medication management.   Aram Beecham. 07/05/2020

## 2020-07-05 NOTE — Progress Notes (Signed)
   07/04/20 2043  Psych Admission Type (Psych Patients Only)  Admission Status Voluntary  Psychosocial Assessment  Patient Complaints Anxiety;Depression;Isolation;Insomnia;Sadness;Substance abuse;Worrying  Eye Contact Avoids  Facial Expression Flat;Sad  Affect Anxious;Appropriate to circumstance;Depressed;Sad;Flat  Speech Logical/coherent;Soft;Slow  Interaction Forwards little;Guarded;Minimal;Isolative  Motor Activity Slow  Appearance/Hygiene Disheveled;In scrubs  Behavior Characteristics Anxious;Guarded  Mood Depressed;Anxious;Sad  Thought Process  Coherency WDL  Content WDL  Delusions None reported or observed  Perception WDL  Hallucination None reported or observed  Judgment Impaired  Confusion None  Danger to Self  Current suicidal ideation? Denies  Danger to Others  Danger to Others None reported or observed

## 2020-07-06 MED ORDER — HYDROXYZINE HCL 25 MG PO TABS
25.0000 mg | ORAL_TABLET | Freq: Four times a day (QID) | ORAL | Status: AC | PRN
  Administered 2020-07-07: 25 mg via ORAL
  Filled 2020-07-06: qty 1
  Filled 2020-07-06: qty 10

## 2020-07-06 MED ORDER — LORAZEPAM 1 MG PO TABS
1.0000 mg | ORAL_TABLET | Freq: Four times a day (QID) | ORAL | Status: AC | PRN
  Administered 2020-07-06: 1 mg via ORAL

## 2020-07-06 MED ORDER — RISAQUAD PO CAPS
1.0000 | ORAL_CAPSULE | Freq: Every day | ORAL | Status: DC
  Administered 2020-07-06 – 2020-07-10 (×5): 1 via ORAL
  Filled 2020-07-06: qty 1
  Filled 2020-07-06: qty 7
  Filled 2020-07-06: qty 1
  Filled 2020-07-06: qty 7
  Filled 2020-07-06: qty 1
  Filled 2020-07-06: qty 7
  Filled 2020-07-06 (×2): qty 1

## 2020-07-06 MED ORDER — PROCHLORPERAZINE MALEATE 10 MG PO TABS
10.0000 mg | ORAL_TABLET | Freq: Three times a day (TID) | ORAL | Status: DC
  Administered 2020-07-06 – 2020-07-09 (×9): 10 mg via ORAL
  Filled 2020-07-06 (×12): qty 1

## 2020-07-06 MED ORDER — LOPERAMIDE HCL 2 MG PO CAPS: 2.0000 mg | ORAL_CAPSULE | ORAL | Status: AC | PRN

## 2020-07-06 MED ORDER — ONDANSETRON 4 MG PO TBDP: 8.0000 mg | ORAL_TABLET | Freq: Four times a day (QID) | ORAL | Status: AC | PRN

## 2020-07-06 NOTE — Progress Notes (Signed)
Recreation Therapy Notes  Date:  7.1.22 Time: 0930 Location: [address] Dayroom  Group Topic: Stress Management  Goal Area(s) Addresses:  Patient will identify positive stress management techniques. Patient will identify benefits of using stress management post d/c.  Intervention: Stress Management  Activity: Meditation.  LRT played a meditation that focused on setting boundaries and not being afraid to put self first sometimes even when it makes others uncomfortable.    Education:  Stress Management, Discharge Planning.   Education Outcome: Acknowledges Education  Clinical Observations/Feedback: Pt did not attend group session.    Caroll Rancher, LRT/CTRS         Lillia Abed, Zyana Amaro A 07/06/2020 11:21 AM

## 2020-07-06 NOTE — [facility] Group Notes (Signed)
Type of Therapy:  Group Therapy: Gratitude   Participation Level:  Did Not Attend  Summary of Progress/Problems: Did Not Attend  Fredirick Lathe, LCSWA Clinicial Social Worker [employer]

## 2020-07-06 NOTE — Progress Notes (Signed)
Adult Psychoeducational Group Note  Date:  07/06/2020 Time:  1:08 AM  Group Topic/Focus:  Wrap-Up Group:   The focus of this group is to help patients review their daily goal of treatment and discuss progress on daily workbooks.  Participation Level:  Did Not Attend  Participation Quality:  Did Not Attend  Affect:  Did Not Attend  Cognitive:  Did Not Attend  Insight: None  Engagement in Group:  Did Not Attend  Modes of Intervention:  Did Not Attend  Additional Comments:  Pt did not attend evening wrap up group tonight  Nicoletta Dress 07/06/2020, 1:08 AM

## 2020-07-06 NOTE — Progress Notes (Signed)
[facility] MD Progress Note  07/06/2020 11:27 AM Tina Torres  MRN:  [id number] Subjective: Patient is a 50 year old female with a past psychiatric history significant for polysubstance use disorders, substance-induced mood disorder and suicidal ideation.  The patient presented to the [facility] emergency department on 07/02/2020 with suicidal ideation.  Objective: Patient is seen and examined.  Patient is a 50 year old female with the above-stated past psychiatric history who is seen in follow-up.  She is essentially unchanged from yesterday.  She continues to state that she is in withdrawal.  She stated she feels bad.  She stated that she is still intermittently suicidal.  The patient admitted on admission to having used fentanyl, heroin, crack and methamphetamines.  We will have to find out for sure when she left [facility] to find out how many days between leaving there and coming here she had the ability to abuse substances.  If that was just a few days it would suggest that her symptoms are being augmented.  This morning her vital signs are stable, she is afebrile.  She does have a mild tachycardia with a rate of 105.  Her sleep is still poor, and she only got 4.25 hours of sleep last night.  Her most recent CIWA this a.m. was 10.  Review of her vital signs on admission really only showed a mildly elevated MCV at 100.7.  Her creatinine was normal at 1.05.  Her liver function enzymes were normal.  Neutrophil count was mildly elevated at 9.5.  Her gonorrhea was positive, and she is received ceftriaxone, Flagyl and doxycycline.  That may be leading to some of the nausea and diarrhea that she is currently having.  Again her blood alcohol on admission was negative, drug screen was positive for amphetamines and cocaine, but no opiates.  Principal Problem: Polysubstance abuse (HCC) Diagnosis: Principal Problem:   Polysubstance abuse (HCC) Active Problems:   MDD (major depressive disorder),  recurrent severe, without psychosis (HCC)  Total Time spent with patient: 20 minutes  Past Psychiatric History: See admission H&P  Past Medical History:  Past Medical History:  Diagnosis Date   Depression    Diverticulitis    Seizures (HCC)     Past Surgical History:  Procedure Laterality Date   COLOSTOMY REVERSAL     Family History: History reviewed. No pertinent family history. Family Psychiatric  History: See admission H&P Social History:  Social History   Substance and Sexual Activity  Alcohol Use Not Currently   Comment: last drink was 2/5th of liquor, been drinking "1 pint or more every day for a couple of weeks now"      Social History   Substance and Sexual Activity  Drug Use Not Currently   Types: Other-see comments, "Crack" cocaine, Heroin, Fentanyl   Comment: "kratom," pt said it's synthetic heroin    Social History   Socioeconomic History   Marital status: Unknown    Spouse name: Not on file   Number of children: Not on file   Years of education: Not on file   Highest education level: Not on file  Occupational History   Not on file  Tobacco Use   Smoking status: Every Day    Packs/day: 1.00    Pack years: 0.00    Types: Cigarettes   Smokeless tobacco: Never  Vaping Use   Vaping Use: Never used  Substance and Sexual Activity   Alcohol use: Not Currently    Comment: last drink was 2/5th of liquor, been  drinking "1 pint or more every day for a couple of weeks now"    Drug use: Not Currently    Types: Other-see comments, "Crack" cocaine, Heroin, Fentanyl    Comment: "kratom," pt said it's synthetic heroin   Sexual activity: Not Currently  Other Topics Concern   Not on file  Social History Narrative   ** Merged History Encounter **       Social Determinants of Health   Financial Resource Strain: Not on file  Food Insecurity: Not on file  Transportation Needs: Not on file  Physical Activity: Not on file  Stress: Not on file  Social  Connections: Not on file   Additional Social History:                         Sleep: Fair  Appetite:  Poor  Current Medications: Current Facility-Administered Medications  Medication Dose Route Frequency Provider Last Rate Last Admin   acetaminophen (TYLENOL) tablet 650 mg  650 mg Oral Q6H PRN Antonieta Pert, MD   650 mg at 07/06/20 0353   alum & mag hydroxide-simeth (MAALOX/MYLANTA) 200-200-20 MG/5ML suspension 30 mL  30 mL Oral Q4H PRN Antonieta Pert, MD       citalopram (CELEXA) tablet 40 mg  40 mg Oral Daily Antonieta Pert, MD   40 mg at 07/06/20 0900   doxycycline (VIBRA-TABS) tablet 100 mg  100 mg Oral Q12H Antonieta Pert, MD   100 mg at 07/06/20 0900   folic acid (FOLVITE) tablet 1 mg  1 mg Oral Daily Antonieta Pert, MD   1 mg at 07/06/20 0900   gabapentin (NEURONTIN) capsule 400 mg  400 mg Oral TID Antonieta Pert, MD   400 mg at 07/06/20 0900   hydrOXYzine (ATARAX/VISTARIL) tablet 25 mg  25 mg Oral Q6H PRN Antonieta Pert, MD   25 mg at 07/06/20 0919   loperamide (IMODIUM) capsule 2-4 mg  2-4 mg Oral PRN Antonieta Pert, MD   2 mg at 07/04/20 2043   LORazepam (ATIVAN) tablet 1 mg  1 mg Oral Q6H PRN Antonieta Pert, MD   1 mg at 07/06/20 0353   magnesium hydroxide (MILK OF MAGNESIA) suspension 30 mL  30 mL Oral Daily PRN Antonieta Pert, MD       metroNIDAZOLE (FLAGYL) tablet 500 mg  500 mg Oral Q12H Antonieta Pert, MD   500 mg at 07/06/20 0900   multivitamin with minerals tablet 1 tablet  1 tablet Oral Daily Antonieta Pert, MD   1 tablet at 07/05/20 0945   nicotine (NICODERM CQ - dosed in mg/24 hours) patch 21 mg  21 mg Transdermal Daily Antonieta Pert, MD   21 mg at 07/06/20 0900   ondansetron (ZOFRAN-ODT) disintegrating tablet 4 mg  4 mg Oral Q6H PRN Antonieta Pert, MD   4 mg at 07/04/20 2226   thiamine tablet 100 mg  100 mg Oral Daily Antonieta Pert, MD   100 mg at 07/06/20 0900    Lab Results: No results found  for this or any previous visit (from the past 48 hour(s)).  Blood Alcohol level:  Lab Results  Component Value Date   [facility] <10 07/02/2020   ETH <10 06/09/2020    Metabolic Disorder Labs: Lab Results  Component Value Date   HGBA1C 5.3 02/21/2020   MPG 105 02/21/2020   MPG 105.41 06/12/2019   No results found for:  PROLACTIN Lab Results  Component Value Date   CHOL 126 06/12/2019   TRIG 219 (H) 06/12/2019   HDL 46 06/12/2019   CHOLHDL 2.7 06/12/2019   VLDL 44 (H) 06/12/2019   LDLCALC 36 06/12/2019    Physical Findings: AIMS: Facial and Oral Movements Muscles of Facial Expression: None, normal Lips and Perioral Area: None, normal Jaw: None, normal Tongue: None, normal,Extremity Movements Upper (arms, wrists, hands, fingers): None, normal Lower (legs, knees, ankles, toes): None, normal, Trunk Movements Neck, shoulders, hips: None, normal, Overall Severity Severity of abnormal movements (highest score from questions above): None, normal Incapacitation due to abnormal movements: None, normal Patient's awareness of abnormal movements (rate only patient's report): No Awareness, Dental Status Current problems with teeth and/or dentures?: No Does patient usually wear dentures?: No  CIWA:  CIWA-Ar Total: 10 COWS:  COWS Total Score: 9  Musculoskeletal: Strength & Muscle Tone: decreased Gait & Station: shuffle Patient leans: N/A  Psychiatric Specialty Exam:  Presentation  General Appearance: Disheveled  Eye Contact:Minimal  Speech:Normal Rate  Speech Volume:Decreased  Handedness:Right   Mood and Affect  Mood:Dysphoric  Affect:Blunt   Thought Process  Thought Processes:Goal Directed  Descriptions of Associations:Circumstantial  Orientation:Full (Time, Place and Person)  Thought Content:Rumination  History of Schizophrenia/Schizoaffective disorder:No  Duration of Psychotic Symptoms:No data recorded Hallucinations:Hallucinations: None  Ideas of  Reference:None  Suicidal Thoughts:Suicidal Thoughts: Yes, Passive SI Passive Intent and/or Plan: Without Intent  Homicidal Thoughts:Homicidal Thoughts: No   Sensorium  Memory:Immediate Poor; Recent Poor; Remote Poor  Judgment:Impaired  Insight:Lacking   Executive Functions  Concentration:Fair  Attention Span:Fair  Recall:Poor  Fund of Knowledge:Fair  Language:Fair   Psychomotor Activity  Psychomotor Activity:Psychomotor Activity: Decreased   Assets  Assets:Desire for Improvement; Resilience   Sleep  Sleep:Sleep: Fair Number of Hours of Sleep: 4.5    Physical Exam: Physical Exam Vitals and nursing note reviewed.  HENT:     Head: Normocephalic and atraumatic.  Pulmonary:     Effort: Pulmonary effort is normal.  Neurological:     General: No focal deficit present.     Mental Status: She is alert and oriented to person, place, and time.   Review of Systems  Constitutional:  Positive for malaise/fatigue.  Gastrointestinal:  Positive for abdominal pain, diarrhea, nausea and vomiting.  Psychiatric/Behavioral:  The patient is nervous/anxious and has insomnia.   All other systems reviewed and are negative. Blood pressure 104/70, pulse (!) 105, temperature 97.8 F (36.6 C), temperature source Oral, resp. rate 16, height 5\' 1"  (1.549 m), weight 73 kg, last menstrual period 08/22/2019, SpO2 96 %. Body mass index is 30.42 kg/m.   Treatment Plan Summary: Daily contact with patient to assess and evaluate symptoms and progress in treatment, Medication management, and Plan patient is seen and examined.  Patient is a 50 year old female with the above-stated past psychiatric history who is seen in follow-up.  Diagnosis: 1.  Opiate withdrawal 2.  Opiate dependence 3.  Amphetamine withdrawal 4.  Amphetamine dependence 5.  Gonorrhea 6.  Cocaine withdrawal 7.  Cocaine dependence 8.  Alcohol dependence 9.  Alcohol withdrawal 10.  Substance-induced mood  disorder  Pertinent findings on examination today: 1.  Patient continues to feel nauseated, fatigue, tremulousness, weakness and lethargy. 2.  Patient continues to have fleeting suicidal ideation.  Plan: 1.  Continue citalopram 40 mg p.o. daily for anxiety and depression. 2.  Continue doxycycline 100 mg p.o. twice daily for gonorrhea.  This was started on 07/04/2020. 3.  Continue folic acid 1 mg  p.o. daily for nutritional supplementation. 4.  Continue gabapentin 400 mg p.o. 3 times daily for anxiety, mood stability and alcohol dependence. 5.  Continue hydroxyzine 25 mg p.o. every 6 hours as needed anxiety or agitation. 6.  Continue Imodium 2 to 4 mg as needed diarrhea. 7.  Continue lorazepam 1 mg p.o. every 6 hours as needed a CIWA greater than 10. 8.  Continue Flagyl 500 mg p.o. every 12 hours for STDs which was started on 07/04/2020. 9.  Continue Zofran but increase to 8 mg p.o. every 6 hours as needed nausea and vomiting. 11.  Add Compazine 10 mg p.o. 3 times daily for continued nausea, vomiting and withdrawal symptoms. 12.  Continue thiamine 100 mg p.o. daily for nutritional supplementation. 13.  Order C. difficile for potential complications of her withdrawal and being treated for gonorrhea. 14.  Disposition planning-in progress.  Antonieta Pert, MD 07/06/2020, 11:27 AM

## 2020-07-06 NOTE — Progress Notes (Addendum)
   07/06/20 1226  Psych Admission Type (Psych Patients Only)  Admission Status Voluntary  Psychosocial Assessment  Patient Complaints Substance abuse;Depression  Eye Contact Avoids;Brief  Facial Expression Flat;Sad  Affect Anxious;Depressed  Speech Logical/coherent;Soft  Interaction Minimal;Guarded  Motor Activity Slow  Appearance/Hygiene Disheveled;In scrubs  Behavior Characteristics Guarded  Mood Depressed  Thought Process  Coherency WDL  Content WDL  Delusions None reported or observed  Perception WDL  Hallucination None reported or observed  Judgment Impaired  Confusion None  Danger to Self  Current suicidal ideation? Denies  Danger to Others  Danger to Others None reported or observed   D. Pt presents with a depressed affect/ mood- remained in bed this am with c/o withdrawal symptoms. Pt did eat lunch but did not go down to cafeteria.  Pt currently denies SI/HI and AVH  A. Labs and vitals monitored. Pt given and educated on medications. Pt supported emotionally and encouraged to express concerns and ask questions.   R. Pt remains safe with 15 minute checks. Will continue POC.

## 2020-07-07 NOTE — Progress Notes (Signed)
     07/06/20 2155  Psych Admission Type (Psych Patients Only)  Admission Status Voluntary  Psychosocial Assessment  Patient Complaints Anxiety;Depression  Eye Contact Avoids;Brief  Facial Expression Flat;Sad;Pensive  Affect Anxious;Depressed  Speech Logical/coherent;Soft  Interaction Minimal;Guarded  Motor Activity Slow  Appearance/Hygiene Disheveled;In scrubs  Behavior Characteristics Cooperative;Guarded  Mood Depressed  Thought Process  Coherency WDL  Content WDL  Delusions None reported or observed  Perception WDL  Hallucination None reported or observed  Judgment Impaired  Confusion None  Danger to Self  Current suicidal ideation? Denies  Danger to Others  Danger to Others None reported or observed

## 2020-07-07 NOTE — Progress Notes (Signed)
Pt did not attend goals group today. 

## 2020-07-07 NOTE — [facility] Group Notes (Signed)
  [facility] LCSW Group Therapy Note  Date/Time:  07/07/2020 10:00AM-11:00AM  Type of Therapy and Topic:  Group Therapy:  Self-Care after Hospitalization  Participation Level:  Did Not Attend   Description of Group This process group involved patients discussing how they plan to take care of themselves in a better manner when they get home from the hospital.  The group started with patients listing one healthy and one unhealthy way they took care of themselves prior to hospitalization.  A discussion ensued about the differences in healthy and unhealthy coping skills.  Group members shared ideas about making changes when they return home so that they can stay well and in recovery.  The white board was used to list ideas so that patients can continue to see these ideas throughout the day.  Therapeutic Goals Patient will identify and describe one healthy and one unhealthy coping technique used prior to hospitalization Patient will participate in generating ideas about healthy self-care options when they return to the community Patients will be supportive of one another and receive said support from others Patient will identify one healthy self-care activity to add to his/her post-hospitalization life that can help in recovery  Summary of Patient Progress:  The patient was invited to group, did not attend.   Therapeutic Modalities Brief Solution-Focused Therapy Motivational Interviewing Psychoeducation   Ambrose Mantle, LCSW 07/07/2020, 12:00pm

## 2020-07-07 NOTE — Progress Notes (Signed)
   CIWA assessed and pt scored 11 lastnight, pt requested Ativan.  RN went to pt room to call down for Ativan, pt stated would sleep a little and then come down.  Pt assessed again at 0600 at bedside.  CIWA score = 11 again.  RN asked pt if she would like to come down for Ativan and get breakfast. Pt stated would sleep.  No Ativan administered.

## 2020-07-07 NOTE — Progress Notes (Signed)
Psychoeducational Group Note  Date:  07/07/2020 Time:  2015  Group Topic/Focus:  Wrap up group  Participation Level: Did Not Attend  Participation Quality:  Not Applicable  Affect:  Not Applicable  Cognitive:  Not Applicable  Insight:  Not Applicable  Engagement in Group: Not Applicable  Additional Comments:  Did not attend.   Marcille Buffy 07/07/2020, 8:47 PM

## 2020-07-07 NOTE — Plan of Care (Signed)
  Problem: Activity: Goal: Interest or engagement in leisure activities will improve Outcome: Not Progressing   Problem: Coping: Goal: Coping ability will improve Outcome: Not Progressing   Problem: Self-Concept: Goal: Level of anxiety will decrease Outcome: Not Progressing

## 2020-07-07 NOTE — [facility] Suicide Risk Assessment (Signed)
[facility] INPATIENT:  Family/Significant Other Suicide Prevention Education  Suicide Prevention Education:  Education Completed; Pt's friend, Hessie Dibble [phone] has been identified by the patient as the family member/significant other with whom the patient will be residing, and identified as the person(s) who will aid the patient in the event of a mental health crisis (suicidal ideations/suicide attempt).  With written consent from the patient, the family member/significant other has been provided the following suicide prevention education, prior to the and/or following the discharge of the patient.  The suicide prevention education provided includes the following: Suicide risk factors Suicide prevention and interventions National Suicide Hotline telephone number [facility] assessment telephone number [facility] Emergency Assistance 911 [facility] and/or Residential Mobile Crisis Unit telephone number  Request made of family/significant other to: Remove weapons (e.g., guns, rifles, knives), all items previously/currently identified as safety concern.   Remove drugs/medications (over-the-counter, prescriptions, illicit drugs), all items previously/currently identified as a safety concern.  The family member/significant other verbalizes understanding of the suicide prevention education information provided.  The family member/significant other agrees to remove the items of safety concern listed above.  Pt's friend shared that she spoke with a contact named "Brandon" at an [address] in [place], [place]. [facility] to [facility] once she is cleared to discharge from the hospital "If [place] is willing to go to [place]." Pt's friends states that pt does not feel that anyone cares about her. She reports that in the past, pt did very well in [place] in terms of mental health and sobriety, but when she moved to this area, pt decompensated and relapsed. CSW to speak  with pt about this and encourage pt to contact her friend to coordinate an effective aftercare plan. Pt will need follow-up in [place] if she decides to go to this [facility].   Rona Ravens, MSW LCSW 07/07/2020, 12:36 PM

## 2020-07-07 NOTE — Progress Notes (Signed)
   07/07/20 1000  Psych Admission Type (Psych Patients Only)  Admission Status Voluntary  Psychosocial Assessment  Patient Complaints Substance abuse;Depression  Eye Contact Brief  Facial Expression Flat;Sad  Affect Anxious;Depressed  Speech Logical/coherent;Soft  Interaction Minimal;Guarded  Motor Activity Slow  Appearance/Hygiene Disheveled;In scrubs  Behavior Characteristics Cooperative;Unwilling to participate  Mood Depressed  Thought Process  Coherency WDL  Content WDL  Delusions None reported or observed  Perception WDL  Hallucination None reported or observed  Judgment Impaired  Confusion None  Danger to Self  Current suicidal ideation? Denies  Danger to Others  Danger to Others None reported or observed

## 2020-07-07 NOTE — Progress Notes (Signed)
[facility] MD Progress Note  07/07/2020 11:09 AM Tina Torres  MRN:  [id number] Subjective:  Patient is a 50 year old female with a past psychiatric history significant for polysubstance use disorders, substance-induced mood disorder and suicidal ideation.  The patient presented to the [facility] emergency department on 07/02/2020 with suicidal ideation.  Objective: Patient is seen and examined.  Patient is a 50 year old female with the above-stated past psychiatric history is seen in follow-up.  Unfortunately she again remains unchanged.  She is not getting out of bed.  We had a discussion this morning that she needed to get out of bed and start taking care of her self, and also is to start attending groups.  I told her that all she was doing while she was in the hospital was just using [place] for housing, and she needed to show some motivation towards sobriety.  I explained that her vital signs were stable and that she really did not show any gross withdrawal symptoms at this point.  We will have to see if she takes this advice and attempts to help her self.  Again her vital signs are stable, she is afebrile.  Her most recent CIWA was 11.  Sleep was adequate.  No new laboratories.  CDF evaluation was ordered for yesterday, but has not returned.  Principal Problem: Polysubstance abuse (HCC) Diagnosis: Principal Problem:   Polysubstance abuse (HCC) Active Problems:   MDD (major depressive disorder), recurrent severe, without psychosis (HCC)  Total Time spent with patient: 20 minutes  Past Psychiatric History: See admission H&P  Past Medical History:  Past Medical History:  Diagnosis Date   Depression    Diverticulitis    Seizures (HCC)     Past Surgical History:  Procedure Laterality Date   COLOSTOMY REVERSAL     Family History: History reviewed. No pertinent family history. Family Psychiatric  History: See admission H&P Social History:  Social History   Substance and  Sexual Activity  Alcohol Use Not Currently   Comment: last drink was 2/5th of liquor, been drinking "1 pint or more every day for a couple of weeks now"      Social History   Substance and Sexual Activity  Drug Use Not Currently   Types: Other-see comments, "Crack" cocaine, Heroin, Fentanyl   Comment: "kratom," pt said it's synthetic heroin    Social History   Socioeconomic History   Marital status: Unknown    Spouse name: Not on file   Number of children: Not on file   Years of education: Not on file   Highest education level: Not on file  Occupational History   Not on file  Tobacco Use   Smoking status: Every Day    Packs/day: 1.00    Pack years: 0.00    Types: Cigarettes   Smokeless tobacco: Never  Vaping Use   Vaping Use: Never used  Substance and Sexual Activity   Alcohol use: Not Currently    Comment: last drink was 2/5th of liquor, been drinking "1 pint or more every day for a couple of weeks now"    Drug use: Not Currently    Types: Other-see comments, "Crack" cocaine, Heroin, Fentanyl    Comment: "kratom," pt said it's synthetic heroin   Sexual activity: Not Currently  Other Topics Concern   Not on file  Social History Narrative   ** Merged History Encounter **       Social Determinants of Health   Financial Resource Strain: Not on  file  Food Insecurity: Not on file  Transportation Needs: Not on file  Physical Activity: Not on file  Stress: Not on file  Social Connections: Not on file   Additional Social History:                         Sleep: Fair  Appetite:  Fair  Current Medications: Current Facility-Administered Medications  Medication Dose Route Frequency Provider Last Rate Last Admin   acetaminophen (TYLENOL) tablet 650 mg  650 mg Oral Q6H PRN Antonieta Pert, MD   650 mg at 07/06/20 0353   acidophilus (RISAQUAD) capsule 1 capsule  1 capsule Oral Daily Antonieta Pert, MD   1 capsule at 07/07/20 0900   alum & mag  hydroxide-simeth (MAALOX/MYLANTA) 200-200-20 MG/5ML suspension 30 mL  30 mL Oral Q4H PRN Antonieta Pert, MD       citalopram (CELEXA) tablet 40 mg  40 mg Oral Daily Antonieta Pert, MD   40 mg at 07/07/20 0900   doxycycline (VIBRA-TABS) tablet 100 mg  100 mg Oral Q12H Antonieta Pert, MD   100 mg at 07/07/20 0900   folic acid (FOLVITE) tablet 1 mg  1 mg Oral Daily Antonieta Pert, MD   1 mg at 07/07/20 0900   gabapentin (NEURONTIN) capsule 400 mg  400 mg Oral TID Antonieta Pert, MD   400 mg at 07/07/20 0900   hydrOXYzine (ATARAX/VISTARIL) tablet 25 mg  25 mg Oral Q6H PRN Antonieta Pert, MD       loperamide (IMODIUM) capsule 2-4 mg  2-4 mg Oral PRN Antonieta Pert, MD       LORazepam (ATIVAN) tablet 1 mg  1 mg Oral Q6H PRN Antonieta Pert, MD   1 mg at 07/06/20 1717   magnesium hydroxide (MILK OF MAGNESIA) suspension 30 mL  30 mL Oral Daily PRN Antonieta Pert, MD       metroNIDAZOLE (FLAGYL) tablet 500 mg  500 mg Oral Q12H Antonieta Pert, MD   500 mg at 07/07/20 0900   multivitamin with minerals tablet 1 tablet  1 tablet Oral Daily Antonieta Pert, MD   1 tablet at 07/07/20 0900   nicotine (NICODERM CQ - dosed in mg/24 hours) patch 21 mg  21 mg Transdermal Daily Antonieta Pert, MD   21 mg at 07/07/20 0900   prochlorperazine (COMPAZINE) tablet 10 mg  10 mg Oral TID Antonieta Pert, MD   10 mg at 07/07/20 0900   thiamine tablet 100 mg  100 mg Oral Daily Antonieta Pert, MD   100 mg at 07/07/20 0900    Lab Results: No results found for this or any previous visit (from the past 48 hour(s)).  Blood Alcohol level:  Lab Results  Component Value Date   ETH <10 07/02/2020   ETH <10 06/09/2020    Metabolic Disorder Labs: Lab Results  Component Value Date   HGBA1C 5.3 02/21/2020   MPG 105 02/21/2020   MPG 105.41 06/12/2019   No results found for: PROLACTIN Lab Results  Component Value Date   CHOL 126 06/12/2019   TRIG 219 (H) 06/12/2019   HDL  46 06/12/2019   CHOLHDL 2.7 06/12/2019   VLDL 44 (H) 06/12/2019   LDLCALC 36 06/12/2019    Physical Findings: AIMS: Facial and Oral Movements Muscles of Facial Expression: None, normal Lips and Perioral Area: None, normal Jaw: None, normal Tongue: None, normal,Extremity Movements  Upper (arms, wrists, hands, fingers): None, normal Lower (legs, knees, ankles, toes): None, normal, Trunk Movements Neck, shoulders, hips: None, normal, Overall Severity Severity of abnormal movements (highest score from questions above): None, normal Incapacitation due to abnormal movements: None, normal Patient's awareness of abnormal movements (rate only patient's report): No Awareness, Dental Status Current problems with teeth and/or dentures?: No Does patient usually wear dentures?: No  CIWA:  CIWA-Ar Total: 11 COWS:  COWS Total Score: 6  Musculoskeletal: Strength & Muscle Tone: decreased Gait & Station: shuffle Patient leans: N/A  Psychiatric Specialty Exam:  Presentation  General Appearance: Disheveled  Eye Contact:Minimal  Speech:Normal Rate  Speech Volume:Decreased  Handedness:Right   Mood and Affect  Mood:Dysphoric  Affect:Blunt   Thought Process  Thought Processes:Goal Directed  Descriptions of Associations:Circumstantial  Orientation:Full (Time, Place and Person)  Thought Content:Rumination  History of Schizophrenia/Schizoaffective disorder:No  Duration of Psychotic Symptoms:No data recorded Hallucinations:Hallucinations: None  Ideas of Reference:None  Suicidal Thoughts:Suicidal Thoughts: Yes, Passive SI Passive Intent and/or Plan: Without Intent  Homicidal Thoughts:Homicidal Thoughts: No   Sensorium  Memory:Immediate Poor; Recent Poor; Remote Poor  Judgment:Impaired  Insight:Lacking   Executive Functions  Concentration:Fair  Attention Span:Fair  Recall:Poor  Fund of Knowledge:Fair  Language:Fair   Psychomotor Activity  Psychomotor  Activity:Psychomotor Activity: Decreased   Assets  Assets:Desire for Improvement; Resilience   Sleep  Sleep:Sleep: Fair Number of Hours of Sleep: 4.5    Physical Exam: Physical Exam Vitals and nursing note reviewed.  HENT:     Head: Normocephalic and atraumatic.  Pulmonary:     Effort: Pulmonary effort is normal.  Neurological:     General: No focal deficit present.     Mental Status: She is alert and oriented to person, place, and time.   Review of Systems  Gastrointestinal:  Positive for abdominal pain and nausea.  Musculoskeletal:  Positive for back pain, joint pain and myalgias.  All other systems reviewed and are negative. Blood pressure 106/74, pulse 100, temperature 98.1 F (36.7 C), resp. rate 16, height 5\' 1"  (1.549 m), weight 73 kg, last menstrual period 08/22/2019, SpO2 100 %. Body mass index is 30.42 kg/m.   Treatment Plan Summary: Daily contact with patient to assess and evaluate symptoms and progress in treatment, Medication management, and Plan patient is seen and examined.  Patient is a 50 year old female with the above-stated past psychiatric history who is seen in follow-up.  Diagnosis: 1.  Opiate withdrawal 2.  Opiate dependence 3.  Amphetamine withdrawal 4.  Amphetamine dependence 5.  Gonorrhea 6.  Cocaine withdrawal 7.  Cocaine dependence 8.  Alcohol dependence 9.  Alcohol withdrawal 10.  Substance-induced mood disorder  Pertinent findings on examination today: 1.  Her vital signs are improving and her CIWA's are improving.  I attempted to encourage her to get out of bed and participate in treatment rather than just laying in bed all day long.  Plan: 1.  Continue citalopram 40 mg p.o. daily for anxiety and depression. 2.  Continue doxycycline 100 mg p.o. twice daily for gonorrhea.  This was started on 07/04/2020. 3.  Continue folic acid 1 mg p.o. daily for nutritional supplementation. 4.  Continue gabapentin 400 mg p.o. 3 times daily for  anxiety, mood stability and alcohol dependence. 5.  Continue hydroxyzine 25 mg p.o. every 6 hours as needed anxiety or agitation. 6.  Continue Imodium 2 to 4 mg as needed diarrhea. 7.  Continue lorazepam 1 mg p.o. every 6 hours as needed a CIWA greater than 10.  8.  Continue Flagyl 500 mg p.o. every 12 hours for STDs which was started on 07/04/2020. 9.  Continue Zofran but increase to 8 mg p.o. every 6 hours as needed nausea and vomiting. 11.  Continue Compazine 10 mg p.o. 3 times daily for continued nausea, vomiting and withdrawal symptoms but changed to as needed.. 12.  Continue thiamine 100 mg p.o. daily for nutritional supplementation. 13.  Order C. difficile for potential complications of her withdrawal and being treated for gonorrhea. 14.  Disposition planning-in progress.  Antonieta Pert, MD 07/07/2020, 11:09 AM

## 2020-07-08 DIAGNOSIS — F1420 Cocaine dependence, uncomplicated: Secondary | ICD-10-CM

## 2020-07-08 DIAGNOSIS — F332 Major depressive disorder, recurrent severe without psychotic features: Principal | ICD-10-CM

## 2020-07-08 NOTE — Progress Notes (Signed)
   07/08/20 2323  Psych Admission Type (Psych Patients Only)  Admission Status Voluntary  Psychosocial Assessment  Patient Complaints Anxiety;Depression  Eye Contact Brief  Facial Expression Flat  Affect Appropriate to circumstance  Speech Logical/coherent  Interaction Minimal  Motor Activity Slow  Appearance/Hygiene Disheveled  Behavior Characteristics Unable to participate  Mood Depressed;Pleasant  Thought Process  Coherency WDL  Content WDL  Delusions None reported or observed  Perception WDL  Hallucination None reported or observed  Judgment Impaired  Confusion None  Danger to Self  Current suicidal ideation? Denies  Danger to Others  Danger to Others None reported or observed

## 2020-07-08 NOTE — Progress Notes (Signed)
[facility]  07/08/2020 12:50 PM [name]  MRN:  [id number]  Subjective: Tina Torres reports, "I'm not feeling too good right now. I'm detoxing from opioid & alcohol. The withdrawal symptoms are ,making me feel horrible. I feel sick to my stomach, my muscles hurt/ache & I feel very tired. Sorry, I have not been able to go to the groups. I'm feeling very depressed, but the doctor that saw me yesterday told me to get up & go take a bath like that will cure me. I thought he really did not care".  Objective: Patient is a 50 year old female with a past psychiatric history significant for polysubstance use disorders, substance-induced mood disorder and suicidal ideation.  [facility]/2020 with suicidal ideation.  The patient stated that she had been homeless for the last 2 months, had been using fentanyl, heroin, crack and methamphetamines.  She was requesting detox.  She also reported feeling suicidal.  She had a plan to jump off a bridge.  She had been seen earlier this month on 06/09/2020.  These were similar circumstances at that time. Daily notes: Tina Torres is seen chart reviewed. The chart findings discussed with the treatment team. She is lying down in her bed. She says she is not feeling very well. She described her withdrawal symptoms as horrible. She says she is withdrawing from alcohol & opioids. However, her BAL was < 10 & UDS positive for amphetamine & cocaine. She does have hx of alcoholism. She says she feels sick with nausea, muscle aches & tiredness. She is endorsing symptoms of depression & anxiety. She rates her depression & anxiety both #9. Tina Torres is on Citalopram for her depression. She is currently not attending group sessions because of harsh withdrawal symptoms she says. She is taking & tolerating her treatment regimen without any adverse effects or reactions. She currently denies any SIHI, AVH, delusional  thoughts or paranoia. She does not appear to be responding to any internal stimuli. She is encouraged to come out of bed & attend group sessions. No changes made on her current plan of care. Patient is in agreement to continue current plan care as already in progress. Her vital signs are stable.  Principal Problem: Severe recurrent major depression without psychotic features (HCC)  Diagnosis: Principal Problem:   Severe recurrent major depression without psychotic features (HCC) Active Problems:   Methamphetamine use disorder, severe (HCC)   Cocaine use disorder, severe, dependence (HCC)   Polysubstance abuse (HCC)   MDD (major depressive disorder), recurrent severe, without psychosis (HCC)  Total Time spent with patient:  25 minutes  Past Psychiatric History: See H&P  Past Medical History:  Past Medical History:  Diagnosis Date   Depression    Diverticulitis    Seizures (HCC)     Past Surgical History:  Procedure Laterality Date   COLOSTOMY REVERSAL     Family History: History reviewed. No pertinent family history.  Family Psychiatric  History: See H&P  Social History:  Social History   Substance and Sexual Activity  Alcohol Use Not Currently   Comment: last drink was 2/5th of liquor, been drinking "1 pint or more every day for a couple of weeks now"      Social History   Substance and Sexual Activity  Drug Use Not Currently   Types: Other-see comments, "Crack" cocaine, Heroin, Fentanyl   Comment: "kratom," pt said it's synthetic heroin    Social History  Socioeconomic History   Marital status: Unknown    Spouse name: Not on file   Number of children: Not on file   Years of education: Not on file   Highest education level: Not on file  Occupational History   Not on file  Tobacco Use   Smoking status: Every Day    Packs/day: 1.00    Pack years: 0.00    Types: Cigarettes   Smokeless tobacco: Never  Vaping Use   Vaping Use: Never used  Substance and  Sexual Activity   Alcohol use: Not Currently    Comment: last drink was 2/5th of liquor, been drinking "1 pint or more every day for a couple of weeks now"    Drug use: Not Currently    Types: Other-see comments, "Crack" cocaine, Heroin, Fentanyl    Comment: "kratom," pt said it's synthetic heroin   Sexual activity: Not Currently  Other Topics Concern   Not on file  Social History Narrative   ** Merged History Encounter **       Social Determinants of Health   Financial Resource Strain: Not on file  Food Insecurity: Not on file  Transportation Needs: Not on file  Physical Activity: Not on file  Stress: Not on file  Social Connections: Not on file   Additional Social History:   Sleep: Good  Appetite:  Good  Current Medications: Current Facility-Administered Medications  Medication Dose Route Frequency Provider Last Rate Last Admin   acetaminophen (TYLENOL) tablet 650 mg  650 mg Oral Q6H PRN Antonieta Pert, MD   650 mg at 07/06/20 0353   acidophilus (RISAQUAD) capsule 1 capsule  1 capsule Oral Daily Antonieta Pert, MD   1 capsule at 07/08/20 0900   alum & mag hydroxide-simeth (MAALOX/MYLANTA) 200-200-20 MG/5ML suspension 30 mL  30 mL Oral Q4H PRN Antonieta Pert, MD       citalopram (CELEXA) tablet 40 mg  40 mg Oral Daily Antonieta Pert, MD   40 mg at 07/08/20 0900   doxycycline (VIBRA-TABS) tablet 100 mg  100 mg Oral Q12H Antonieta Pert, MD   100 mg at 07/08/20 0900   folic acid (FOLVITE) tablet 1 mg  1 mg Oral Daily Antonieta Pert, MD   1 mg at 07/08/20 0900   gabapentin (NEURONTIN) capsule 400 mg  400 mg Oral TID Antonieta Pert, MD   400 mg at 07/08/20 0900   hydrOXYzine (ATARAX/VISTARIL) tablet 25 mg  25 mg Oral Q6H PRN Antonieta Pert, MD   25 mg at 07/07/20 1706   loperamide (IMODIUM) capsule 2-4 mg  2-4 mg Oral PRN Antonieta Pert, MD       LORazepam (ATIVAN) tablet 1 mg  1 mg Oral Q6H PRN Antonieta Pert, MD   1 mg at 07/06/20 1717    magnesium hydroxide (MILK OF MAGNESIA) suspension 30 mL  30 mL Oral Daily PRN Antonieta Pert, MD       metroNIDAZOLE (FLAGYL) tablet 500 mg  500 mg Oral Q12H Antonieta Pert, MD   500 mg at 07/08/20 0900   multivitamin with minerals tablet 1 tablet  1 tablet Oral Daily Antonieta Pert, MD   1 tablet at 07/08/20 0900   nicotine (NICODERM CQ - dosed in mg/24 hours) patch 21 mg  21 mg Transdermal Daily Antonieta Pert, MD   21 mg at 07/08/20 0900   prochlorperazine (COMPAZINE) tablet 10 mg  10 mg Oral TID Landry Mellow  Hart Rochester, MD   10 mg at 07/08/20 0900   thiamine tablet 100 mg  100 mg Oral Daily Antonieta Pert, MD   100 mg at 07/08/20 0900    Lab Results:  No results found for this or any previous visit (from the past 48 hour(s)). Blood Alcohol level:  Lab Results  Component Value Date   ETH <10 07/02/2020   ETH <10 06/09/2020   Metabolic Disorder Labs: Lab Results  Component Value Date   HGBA1C 5.3 02/21/2020   MPG 105 02/21/2020   MPG 105.41 06/12/2019   No results found for: PROLACTIN Lab Results  Component Value Date   CHOL 126 06/12/2019   TRIG 219 (H) 06/12/2019   HDL 46 06/12/2019   CHOLHDL 2.7 06/12/2019   VLDL 44 (H) 06/12/2019   LDLCALC 36 06/12/2019   Physical Findings: AIMS: Facial and Oral Movements Muscles of Facial Expression: None, normal Lips and Perioral Area: None, normal Jaw: None, normal Tongue: None, normal,Extremity Movements Upper (arms, wrists, hands, fingers): None, normal Lower (legs, knees, ankles, toes): None, normal, Trunk Movements Neck, shoulders, hips: None, normal, Overall Severity Severity of abnormal movements (highest score from questions above): None, normal Incapacitation due to abnormal movements: None, normal Patient's awareness of abnormal movements (rate only patient's report): No Awareness, Dental Status Current problems with teeth and/or dentures?: No Does patient usually wear dentures?: No  CIWA:  CIWA-Ar  Total: 5 COWS:  COWS Total Score: 6  Musculoskeletal: Strength & Muscle Tone: within normal limits Gait & Station: normal Patient leans: N/A  Psychiatric Specialty Exam: Physical Exam Vitals and nursing note reviewed.  HENT:     Head: Normocephalic.     Mouth/Throat:     Pharynx: Oropharynx is clear.  Eyes:     Pupils: Pupils are equal, round, and reactive to light.  Neck:     Comments: Deferred Cardiovascular:     Rate and Rhythm: Normal rate.  Pulmonary:     Effort: Pulmonary effort is normal.  Genitourinary:    Comments: Deferred Musculoskeletal:        General: Normal range of motion.     Cervical back: Normal range of motion.  Skin:    General: Skin is warm and dry.  Neurological:     Mental Status: She is alert and oriented to person, place, and time.    Review of Systems  Constitutional:  Negative for chills, diaphoresis and fever.  HENT:  Negative for congestion, rhinorrhea, sneezing and sore throat.   Eyes:  Negative for discharge.  Respiratory:  Negative for chest tightness, shortness of breath and wheezing.   Cardiovascular:  Negative for chest pain and palpitations.  Gastrointestinal:  Negative for diarrhea, nausea and vomiting.  Endocrine: Negative for cold intolerance.  Genitourinary:  Negative for difficulty urinating.  Musculoskeletal:  Negative for arthralgias and myalgias.  Allergic/Immunologic: Negative for environmental allergies and food allergies.  Neurological: Negative.   Psychiatric/Behavioral:  Negative for agitation, behavioral problems, confusion, decreased concentration, dysphoric mood, hallucinations, self-injury, sleep disturbance and suicidal ideas. The patient is not nervous/anxious and is not hyperactive.    Blood pressure 110/76, pulse 85, temperature 97.7 F (36.5 C), temperature source Oral, resp. rate 16, height 5\' 1"  (1.549 m), weight 73 kg, last menstrual period 08/22/2019, SpO2 99 %.Body mass index is 30.42 kg/m.  General  Appearance: Disheveled  Eye Contact: Fair  Speech:  Normal Rate  Volume: Normal  Mood: "feel very depressed & anxious" rates both #9.  Affect: Flat  Thought Process:  Coherent and Descriptions of Associations: Intact  Orientation:  Full (Time, Place, and Person)  Thought Content:  Logical, denies any hallucinations, delusions or paranoia.  Suicidal Thoughts: Denies any current SI, plan or intent  Homicidal Thoughts: Denies  Memory:  Immediate;  Fair Recent; Fair Remote; Fair  Judgement:  Impaired  Insight:  Lacking  Psychomotor Activity: Normal  Concentration:  Concentration: Fair and Attention Span: Fair  Recall:  [employer] of Knowledge:  Fair  Language: Good  Akathisia:  Negative  Handed:  Right  AIMS (if indicated):     Assets:  Desire for Improvement Resilience  ADL's:  Intact  Cognition:  WNL    Sleep:  6.5 hours.   Treatment Plan Summary: Daily contact with patient to assess and evaluate symptoms and progress in treatment and Medication management.  - Continue inpatient hospitalization. - Will continue today 07/08/2020 plan as below except where it is noted.  Depression. Continue Citalopram 40 mg po daily.  Anxiety/Agitation. Continue gabapentin 400 mg po tid. Continue Vistaril 25 mg po Q 6 hours prn for CIWA > 10.  Continue Lorazepam 1 mg po Q 6 hours prn for CIWA > 10.   Other medical issues. Continue doxycycline 100 mg po Q 12 hours for infection. Continue Flagyl 500 mg po Q 12 hours for infection. Continue Multivitamin 1 tablet po daily for low vitamin replacement. Continue thiamine 100 mg po daily for thiamine deficiency.  Continue Folic acid 1 mg po daily for Folate replacement. Continue acidophilus 1 capsul po daily stomach ailments.  Continue Compazine 10 mg po tid for nausea.  Continue nicotine patch 21 mg topically for nicotine withdrawal.   Other prn medications. Continue Tylenol 650 mg po Q 6 hours prn for pain/fever. Continue Mylanta 30 ml po  Q 4 hours prn for indigestion. Continue Imodium 2-4 mg po prn for diarrhea.  Continue MOM 30 ml po q daily for constipation.  Encourage group participation. Discharge disposition plan ongoing.  Armandina Stammer, NP, PMHNP, FNP-BC. 07/08/2020, 12:50 PMPatient ID: Tina Torres, female   DOB: Dec 30, 1970, 50 y.o.   MRN: [id number]

## 2020-07-08 NOTE — Progress Notes (Addendum)
D:  Patient denied SI and HI, contracts for safety.  Denied A/V hallucinations.   A:  Medications administered per MD orders.  Emotional support and encouragement given patient. R:  Safety maintained with 15 minute checks.  Patient has stayed in bed most of the day.

## 2020-07-08 NOTE — [facility] Group Notes (Signed)
[facility] Group Notes: (Clinical Social Work)   07/08/2020      Type of Therapy:  Group Therapy   Participation Level:  Did Not Attend - was invited both individually by [facility] and by overhead announcement, chose not to attend.   Hyde Sires Grossman-Orr, LCSW 07/08/2020, 1:44 PM    

## 2020-07-08 NOTE — Progress Notes (Signed)
   07/07/20 2326  Psych Admission Type (Psych Patients Only)  Admission Status Voluntary  Psychosocial Assessment  Patient Complaints Depression  Eye Contact Brief  Facial Expression Flat;Sad  Affect Anxious;Depressed  Speech Logical/coherent;Soft  Interaction Minimal;Guarded  Motor Activity Slow  Appearance/Hygiene Disheveled;In scrubs  Behavior Characteristics Cooperative  Mood Depressed  Thought Process  Coherency WDL  Content WDL  Delusions None reported or observed  Perception WDL  Hallucination None reported or observed  Judgment Impaired  Confusion None  Danger to Self  Current suicidal ideation? Denies  Danger to Others  Danger to Others None reported or observed

## 2020-07-08 NOTE — Progress Notes (Signed)
Nurse discussed anxiety, depression and coping skills with patient.  

## 2020-07-08 NOTE — Progress Notes (Signed)
Adult Psychoeducational Group Note  Date:  07/08/2020 Time:  8:45 PM  Group Topic/Focus:  Wrap-Up Group:   The focus of this group is to help patients review their daily goal of treatment and discuss progress on daily workbooks.  Participation Level:  Minimal  Participation Quality:  Appropriate  Affect:  Appropriate  Cognitive:  Appropriate  Insight: Appropriate  Engagement in Group:  Engaged  Modes of Intervention:  Education  Additional Comments:  Patient attended and participated in group tonight. She reports  that the most significant thing that happen tofay for her was knowing how much people care about her. They would not let her disappear.   Lita Mains [facility] 07/08/2020, 8:45 PM

## 2020-07-09 MED ORDER — PROCHLORPERAZINE MALEATE 10 MG PO TABS: 10.0000 mg | ORAL_TABLET | Freq: Three times a day (TID) | ORAL | Status: DC | PRN

## 2020-07-09 MED ORDER — TRAZODONE HCL 50 MG PO TABS
50.0000 mg | ORAL_TABLET | Freq: Every evening | ORAL | Status: DC | PRN
  Administered 2020-07-09: 50 mg via ORAL
  Filled 2020-07-09 (×2): qty 1

## 2020-07-09 NOTE — Progress Notes (Addendum)
D:  Patient denied SI and HI, contracts for safety.  Denied A/V hallucinations.   A:  Medications administered per MD orders.  Emotional support and encouragement given patient. R:  Safety maintained with 15 minute checks.  Staff has encouraged patient many times today to get out of bed, attend groups, sit in dayroom, go to meals.  Patient has continued to lay in bed all day.   Approximately 5:00 pm patient gets out of bed, comes to nurses station and requests ativan.

## 2020-07-09 NOTE — Progress Notes (Signed)
Recreation Therapy Notes  Date: 07/09/2020 Time: 930a  Topic: Leisure Participation   Goal Area(s) Addresses:  Patient will complete packet of holiday-theme worksheets, as a healthy free-time activity on unit.  Intervention: Independent Leisure   Education: Leisure Exposure- Word puzzles and Coloring pages   Comments: LRT provided pt a leisure packet in celebration of Independence Day. Pt given the option to complete the packet in the dayroom with [facility] staff and peers or at their own pace in their room.   Sonam Huelsmann, LRT/CTRS  Jaiveon Suppes G Juvon Teater 07/09/2020, 12:13 PM  

## 2020-07-09 NOTE — Progress Notes (Signed)
[facility] MD Progress Note  07/09/2020 12:39 PM Tina Torres  MRN:  [id number]  Reason for admission: Per H & P:  Tina Torres is a 50 year old female with a past psychiatric history significant for polysubstance use disorders, substance-induced mood disorder and suicidal ideation.  The patient presented to the [facility] emergency department on 07/02/2020 with suicidal ideation., stating that she had been homeless for the last 2 months, had been using fentanyl, heroin, crack and methamphetamines. Patient reportedly requesting detox and endorsed suicidal ideations. She had a plan to jump off a bridge.  She had been seen earlier this month on 06/09/2020.  These were similar circumstances at that time.  Subjective/Evaluation on the unit: Nursing notes, vital signs, and chart reviewed. Case discussed in treatment team. Patient was approached in her room, where she was laying down, dozing. Patient provides limited eye contact. Mood is somewhat irritable. She states she is feeling less body aches and is looking forward to going to [employer] in [place] tomorrow. She denies SI/HI/AVH today and is tolerating her medications. She states she doesn't get up "because there is nothing to do here." Encouraged patient to go to groups, get out of bed now that she is feeling physically better. Patient did attend group last evening. The last PRN medication that patent received was hydroxyzine at 1706 on 07/07/20. She is taking her scheduled medications as prescribed. Patient states her appetite has improved. She is sleeping better she says. Patient states she is no longer having diarrhea.      Principal Problem: Severe recurrent major depression without psychotic features (HCC) Diagnosis: Principal Problem:   Severe recurrent major depression without psychotic features (HCC) Active Problems:   Polysubstance abuse (HCC)   MDD (major depressive disorder), recurrent severe, without psychosis (HCC)    Methamphetamine use disorder, severe (HCC)   Cocaine use disorder, severe, dependence (HCC)  Total Time spent with patient: 15 minutes  Past Psychiatric History: Per H & P: "The patient's last psychiatric hospitalization to our facility was in September 2021.  She was admitted with a history of polysubstance use disorders.  She had been admitted also in 2021 in June.  Again at that time it was also secondary to substance related disorders.  Shortly after the September admission she presented to the [facility] and was hospitalized there for 4 days.  Most recently she presented to the [facility] emergency department and was there for 31 hours, and was referred to outpatient resources, and it does appear that she went to [place] for 14 days prior to coming back to our facility."  Past Medical History:  Past Medical History:  Diagnosis Date   Depression    Diverticulitis    Seizures (HCC)     Past Surgical History:  Procedure Laterality Date   COLOSTOMY REVERSAL     Family History: History reviewed. No pertinent family history.  Family Psychiatric  History: Per H  & P: Noncontributory   Social History:  Social History   Substance and Sexual Activity  Alcohol Use Not Currently   Comment: last drink was 2/5th of liquor, been drinking "1 pint or more every day for a couple of weeks now"      Social History   Substance and Sexual Activity  Drug Use Not Currently   Types: Other-see comments, "Crack" cocaine, Heroin, Fentanyl   Comment: "kratom," pt said it's synthetic heroin    Social History   Socioeconomic History  Marital status: Unknown    Spouse name: Not on file   Number of children: Not on file   Years of education: Not on file   Highest education level: Not on file  Occupational History   Not on file  Tobacco Use   Smoking status: Every Day    Packs/day: 1.00    Pack years: 0.00    Types: Cigarettes   Smokeless tobacco: Never   Vaping Use   Vaping Use: Never used  Substance and Sexual Activity   Alcohol use: Not Currently    Comment: last drink was 2/5th of liquor, been drinking "1 pint or more every day for a couple of weeks now"    Drug use: Not Currently    Types: Other-see comments, "Crack" cocaine, Heroin, Fentanyl    Comment: "kratom," pt said it's synthetic heroin   Sexual activity: Not Currently  Other Topics Concern   Not on file  Social History Narrative   ** Merged History Encounter **       Social Determinants of Health   Financial Resource Strain: Not on file  Food Insecurity: Not on file  Transportation Needs: Not on file  Physical Activity: Not on file  Stress: Not on file  Social Connections: Not on file   Additional Social History:      Sleep: Good  Appetite:  Poor  Current Medications: Current Facility-Administered Medications  Medication Dose Route Frequency Provider Last Rate Last Admin   acetaminophen (TYLENOL) tablet 650 mg  650 mg Oral Q6H PRN Antonieta Pert, MD   650 mg at 07/06/20 0353   acidophilus (RISAQUAD) capsule 1 capsule  1 capsule Oral Daily Antonieta Pert, MD   1 capsule at 07/09/20 0751   alum & mag hydroxide-simeth (MAALOX/MYLANTA) 200-200-20 MG/5ML suspension 30 mL  30 mL Oral Q4H PRN Antonieta Pert, MD       citalopram (CELEXA) tablet 40 mg  40 mg Oral Daily Antonieta Pert, MD   40 mg at 07/09/20 0752   doxycycline (VIBRA-TABS) tablet 100 mg  100 mg Oral Q12H Antonieta Pert, MD   100 mg at 07/09/20 8841   folic acid (FOLVITE) tablet 1 mg  1 mg Oral Daily Antonieta Pert, MD   1 mg at 07/09/20 6606   gabapentin (NEURONTIN) capsule 400 mg  400 mg Oral TID Antonieta Pert, MD   400 mg at 07/09/20 1118   hydrOXYzine (ATARAX/VISTARIL) tablet 25 mg  25 mg Oral Q6H PRN Antonieta Pert, MD   25 mg at 07/07/20 1706   loperamide (IMODIUM) capsule 2-4 mg  2-4 mg Oral PRN Antonieta Pert, MD       LORazepam (ATIVAN) tablet 1 mg  1 mg Oral  Q6H PRN Antonieta Pert, MD   1 mg at 07/06/20 1717   magnesium hydroxide (MILK OF MAGNESIA) suspension 30 mL  30 mL Oral Daily PRN Antonieta Pert, MD       metroNIDAZOLE (FLAGYL) tablet 500 mg  500 mg Oral Q12H Antonieta Pert, MD   500 mg at 07/09/20 3016   multivitamin with minerals tablet 1 tablet  1 tablet Oral Daily Antonieta Pert, MD   1 tablet at 07/09/20 0109   nicotine (NICODERM CQ - dosed in mg/24 hours) patch 21 mg  21 mg Transdermal Daily Antonieta Pert, MD   21 mg at 07/09/20 0753   prochlorperazine (COMPAZINE) tablet 10 mg  10 mg Oral Q8H PRN Landry Mellow  Hart RochesterLawson, MD       thiamine tablet 100 mg  100 mg Oral Daily Antonieta Pertlary, Greg Lawson, MD   100 mg at 07/09/20 [id number]    Lab Results: No results found for this or any previous visit (from the past 48 hour(s)).  Blood Alcohol level:  Lab Results  Component Value Date   ETH <10 07/02/2020   ETH <10 06/09/2020    Metabolic Disorder Labs: Lab Results  Component Value Date   HGBA1C 5.3 02/21/2020   MPG 105 02/21/2020   MPG 105.41 06/12/2019   No results found for: PROLACTIN Lab Results  Component Value Date   CHOL 126 06/12/2019   TRIG 219 (H) 06/12/2019   HDL 46 06/12/2019   CHOLHDL 2.7 06/12/2019   VLDL 44 (H) 06/12/2019   LDLCALC 36 06/12/2019    Physical Findings: AIMS: Facial and Oral Movements Muscles of Facial Expression: None, normal Lips and Perioral Area: None, normal Jaw: None, normal Tongue: None, normal,Extremity Movements Upper (arms, wrists, hands, fingers): None, normal Lower (legs, knees, ankles, toes): None, normal, Trunk Movements Neck, shoulders, hips: None, normal, Overall Severity Severity of abnormal movements (highest score from questions above): None, normal Incapacitation due to abnormal movements: None, normal Patient's awareness of abnormal movements (rate only patient's report): No Awareness, Dental Status Current problems with teeth and/or dentures?: No Does patient  usually wear dentures?: No  CIWA:  CIWA-Ar Total: 2 COWS:  COWS Total Score: 6  Musculoskeletal: Strength & Muscle Tone: within normal limits Gait & Station: normal Patient leans: N/A  Psychiatric Specialty Exam:  Presentation  General Appearance: Disheveled  Eye Contact:Fair  Speech:Normal Rate  Speech Volume:Decreased  Handedness:Right   Mood and Affect  Mood:Dysphoric  Affect:Congruent   Thought Process  Thought Processes:Goal Directed  Descriptions of Associations:Intact  Orientation:Full (Time, Place and Person)  Thought Content:WDL  History of Schizophrenia/Schizoaffective disorder:No  Duration of Psychotic Symptoms:No data recorded Hallucinations:Hallucinations: None (Denies)  Ideas of Reference:None (Denies)  Suicidal Thoughts:Suicidal Thoughts: No (Denies)  Homicidal Thoughts:Homicidal Thoughts: No (Denies)   Sensorium  Memory:Immediate Poor; Recent Poor; Remote Poor  Judgment:Fair (at this time-wants rehab)  Insight:Poor   Executive Functions  Concentration:Fair  Attention Span:Fair  Recall:Fair  Fund of Knowledge:Fair  Language:Fair   Psychomotor Activity  Psychomotor Activity:No data recorded   Assets  Assets:Desire for Improvement; Resilience   Sleep  Sleep:Sleep: Good    Physical Exam: Physical Exam Vitals reviewed.  HENT:     Head: Normocephalic.     Nose: No congestion or rhinorrhea.  Eyes:     General:        Right eye: No discharge.        Left eye: No discharge.  Pulmonary:     Effort: Pulmonary effort is normal.  Musculoskeletal:        General: Normal range of motion.     Cervical back: Normal range of motion.  Neurological:     Mental Status: She is oriented to person, place, and time.   Review of Systems  Psychiatric/Behavioral:  Positive for depression and substance abuse (hx of). Negative for hallucinations, memory loss and suicidal ideas. The patient is not nervous/anxious and does not have  insomnia.   All other systems reviewed and are negative. Blood pressure 123/71, pulse 74, temperature 98 F (36.7 C), temperature source Oral, resp. rate 18, height 5\' 1"  (1.549 m), weight 73 kg, last menstrual period 08/22/2019, SpO2 100 %. Body mass index is 30.42 kg/m.   Treatment Plan Summary: Daily contact  with patient to assess and evaluate symptoms and progress in treatment and Medication management  STI (Gonorrhea) -Rocephin IM 500 mg (Completed 07/04/20) -Continue Doxycycline 100 mg every 12 hours -Continue Flagyl 500 mg every 12 hours x 14 days (from 07/04/2020)  Anxiety -Continue Gabapentin 400 mg 3 times daily -hydroxyzine 25 mg every 6 hours prn   Depression -Continue Celexa 40 Mg daily  Nausea/vomiting -Continue Compazine  10 mg every 6 hours prn   Probiotics for loose stools -Continue acidophilus 1 capsule every day  Smoking cessation/cravings -Continue nicotine patch 21 mg daily  Supplements -Continue Thiamin tab 100 mg daily -Continue multivitamin, 1 daily -Continue Folic acid 1 mg daIly  PRNs, Other  -Continue Tylenol 650 mg po every 6 hrs prn, mild pain -Continue MAALOX/MYLANTA 30 mL po every 4 hrs prn indigestion -Continue Milk of Magnesia 30 mL po daily prn mild constipation   -Continue  Imodium 2-4 mg diarrhea  Opiate use disorder -Continue CIWA/COWS protocol   Diarrhea -Sample for C-Diff cancelled, as patient no longer having loose BMs     Vanetta Mulders, NP, PMHNP-BC 07/09/2020, 12:39 PM

## 2020-07-09 NOTE — Progress Notes (Signed)
Patient did not attend morning orientation group.  

## 2020-07-09 NOTE — Plan of Care (Signed)
Nurse discussed anxiety, depression and coping skills with patient.  

## 2020-07-09 NOTE — Tx Team (Signed)
Interdisciplinary Treatment and Diagnostic Plan Update  07/09/2020 Time of Session: 9:15am  Tina Torres MRN: [id number]  Principal Diagnosis: Severe recurrent major depression without psychotic features [facility])  Secondary Diagnoses: Principal Problem:   Severe recurrent major depression without psychotic features (HCC) Active Problems:   Polysubstance abuse (HCC)   Methamphetamine use disorder, severe (HCC)   MDD (major depressive disorder), recurrent severe, without psychosis (HCC)   Cocaine use disorder, severe, dependence (HCC)   Current Medications:  Current Facility-Administered Medications  Medication Dose Route Frequency Provider Last Rate Last Admin   acetaminophen (TYLENOL) tablet 650 mg  650 mg Oral Q6H PRN Antonieta Pert, MD   650 mg at 07/06/20 0353   acidophilus (RISAQUAD) capsule 1 capsule  1 capsule Oral Daily Antonieta Pert, MD   1 capsule at 07/09/20 0751   alum & mag hydroxide-simeth (MAALOX/MYLANTA) 200-200-20 MG/5ML suspension 30 mL  30 mL Oral Q4H PRN Antonieta Pert, MD       citalopram (CELEXA) tablet 40 mg  40 mg Oral Daily Antonieta Pert, MD   40 mg at 07/09/20 0752   doxycycline (VIBRA-TABS) tablet 100 mg  100 mg Oral Q12H Antonieta Pert, MD   100 mg at 07/09/20 2924   folic acid (FOLVITE) tablet 1 mg  1 mg Oral Daily Antonieta Pert, MD   1 mg at 07/09/20 4628   gabapentin (NEURONTIN) capsule 400 mg  400 mg Oral TID Antonieta Pert, MD   400 mg at 07/09/20 0751   hydrOXYzine (ATARAX/VISTARIL) tablet 25 mg  25 mg Oral Q6H PRN Antonieta Pert, MD   25 mg at 07/07/20 1706   loperamide (IMODIUM) capsule 2-4 mg  2-4 mg Oral PRN Antonieta Pert, MD       LORazepam (ATIVAN) tablet 1 mg  1 mg Oral Q6H PRN Antonieta Pert, MD   1 mg at 07/06/20 1717   magnesium hydroxide (MILK OF MAGNESIA) suspension 30 mL  30 mL Oral Daily PRN Antonieta Pert, MD       metroNIDAZOLE (FLAGYL) tablet 500 mg  500 mg Oral Q12H Antonieta Pert,  MD   500 mg at 07/09/20 6381   multivitamin with minerals tablet 1 tablet  1 tablet Oral Daily Antonieta Pert, MD   1 tablet at 07/09/20 7711   nicotine (NICODERM CQ - dosed in mg/24 hours) patch 21 mg  21 mg Transdermal Daily Antonieta Pert, MD   21 mg at 07/09/20 0753   prochlorperazine (COMPAZINE) tablet 10 mg  10 mg Oral TID Antonieta Pert, MD   10 mg at 07/09/20 0751   thiamine tablet 100 mg  100 mg Oral Daily Antonieta Pert, MD   100 mg at 07/09/20 0751   PTA Medications: Medications Prior to Admission  Medication Sig Dispense Refill Last Dose   citalopram (CELEXA) 40 MG tablet Take 1 tablet (40 mg total) by mouth daily. (Patient not taking: Reported on 07/02/2020) 30 tablet 2    gabapentin (NEURONTIN) 400 MG capsule TAKE 1 CAPSULE(400 MG) BY MOUTH THREE TIMES DAILY (Patient not taking: Reported on 07/02/2020) 90 capsule 2     Patient Stressors: Financial difficulties Health problems Medication change or noncompliance Substance abuse  Patient Strengths: General fund of knowledge Physical Health  Treatment Modalities: Medication Management, Group therapy, Case management,  1 to 1 session with clinician, Psychoeducation, Recreational therapy.   Physician Treatment Plan for Primary Diagnosis: Severe recurrent major depression without psychotic features (  HCC) Long Term Goal(s): Improvement in symptoms so as ready for discharge   Short Term Goals: Ability to identify changes in lifestyle to reduce recurrence of condition will improve Ability to verbalize feelings will improve Ability to disclose and discuss suicidal ideas Ability to demonstrate self-control will improve Ability to identify and develop effective coping behaviors will improve Ability to maintain clinical measurements within normal limits will improve Compliance with prescribed medications will improve Ability to identify triggers associated with substance abuse/mental health issues will  improve  Medication Management: Evaluate patient's response, side effects, and tolerance of medication regimen.  Therapeutic Interventions: 1 to 1 sessions, Unit Group sessions and Medication administration.  Evaluation of Outcomes: Progressing  Physician Treatment Plan for Secondary Diagnosis: Principal Problem:   Severe recurrent major depression without psychotic features (HCC) Active Problems:   Polysubstance abuse (HCC)   Methamphetamine use disorder, severe (HCC)   MDD (major depressive disorder), recurrent severe, without psychosis (HCC)   Cocaine use disorder, severe, dependence (HCC)  Long Term Goal(s): Improvement in symptoms so as ready for discharge   Short Term Goals: Ability to identify changes in lifestyle to reduce recurrence of condition will improve Ability to verbalize feelings will improve Ability to disclose and discuss suicidal ideas Ability to demonstrate self-control will improve Ability to identify and develop effective coping behaviors will improve Ability to maintain clinical measurements within normal limits will improve Compliance with prescribed medications will improve Ability to identify triggers associated with substance abuse/mental health issues will improve     Medication Management: Evaluate patient's response, side effects, and tolerance of medication regimen.  Therapeutic Interventions: 1 to 1 sessions, Unit Group sessions and Medication administration.  Evaluation of Outcomes: Progressing   RN Treatment Plan for Primary Diagnosis: Severe recurrent major depression without psychotic features (HCC) Long Term Goal(s): Knowledge of disease and therapeutic regimen to maintain health will improve  Short Term Goals: Ability to remain free from injury will improve, Ability to participate in decision making will improve, Ability to verbalize feelings will improve, Ability to disclose and discuss suicidal ideas, and Ability to identify and develop  effective coping behaviors will improve  Medication Management: RN will administer medications as ordered by provider, will assess and evaluate patient's response and provide education to patient for prescribed medication. RN will report any adverse and/or side effects to prescribing provider.  Therapeutic Interventions: 1 on 1 counseling sessions, Psychoeducation, Medication administration, Evaluate responses to treatment, Monitor vital signs and CBGs as ordered, Perform/monitor CIWA, COWS, AIMS and Fall Risk screenings as ordered, Perform wound care treatments as ordered.  Evaluation of Outcomes: Progressing   LCSW Treatment Plan for Primary Diagnosis: Severe recurrent major depression without psychotic features (HCC) Long Term Goal(s): Safe transition to appropriate next level of care at discharge, Engage patient in therapeutic group addressing interpersonal concerns.  Short Term Goals: Engage patient in aftercare planning with referrals and resources, Increase social support, Facilitate acceptance of mental health diagnosis and concerns, Facilitate patient progression through stages of change regarding substance use diagnoses and concerns, Identify triggers associated with mental health/substance abuse issues, and Increase skills for wellness and recovery  Therapeutic Interventions: Assess for all discharge needs, 1 to 1 time with Social worker, Explore available resources and support systems, Assess for adequacy in community support network, Educate family and significant other(s) on suicide prevention, Complete Psychosocial Assessment, Interpersonal group therapy.  Evaluation of Outcomes: Progressing   Progress in Treatment: Attending groups: No. Participating in groups: No. Taking medication as prescribed: Yes. Toleration  medication: Yes. Family/Significant other contact made: Yes, individual(s) contacted:  If consents are provided  Patient understands diagnosis: No. Discussing  patient identified problems/goals with staff: Yes. Medical problems stabilized or resolved: Yes. Denies suicidal/homicidal ideation: Yes. Issues/concerns per patient self-inventory: No.   New problem(s) identified: No, Describe:  None   New Short Term/Long Term Goal(s): medication stabilization, elimination of SI thoughts, development of comprehensive mental wellness plan.   Patient Goals: Did not attend   Discharge Plan or Barriers: Patient recently admitted. CSW will continue to follow and assess for appropriate referrals and possible discharge planning.   Reason for Continuation of Hospitalization: Depression Medication stabilization Suicidal ideation Withdrawal symptoms  Estimated Length of Stay: 3 to 5 days   Attendees: Patient: Did not attend  07/09/2020   Physician: Landry Mellow, MD 07/09/2020   Nursing:  07/09/2020   RN Care Manager: 07/09/2020   Social Worker: Melba Coon, LCSWA 07/09/2020   Recreational Therapist:  07/09/2020   Other:  07/09/2020   Other:  07/09/2020   Other: 07/09/2020     Scribe for Treatment Team: Felizardo Hoffmann, LCSWA 07/09/2020 9:30 AM

## 2020-07-09 NOTE — Progress Notes (Signed)
Psychoeducational Group Note  Date:  07/09/2020 Time:  2052  Group Topic/Focus:  Wrap-Up Group:   The focus of this group is to help patients review their daily goal of treatment and discuss progress on daily workbooks.  Participation Level: Did Not Attend  Participation Quality:  Not Applicable  Affect:  Not Applicable  Cognitive:  Not Applicable  Insight:  Not Applicable  Engagement in Group: Not Applicable  Additional Comments:  The patient did not attend group.   Hazle Coca S 07/09/2020, 8:52 PM

## 2020-07-10 LAB — HCG, QUANTITATIVE, PREGNANCY: hCG, Beta Chain, Quant, S: 13 m[IU]/mL — ABNORMAL HIGH (ref <5)

## 2020-07-10 MED ORDER — DOXYCYCLINE HYCLATE 100 MG PO TABS: 100.0000 mg | ORAL_TABLET | Freq: Two times a day (BID) | ORAL | 0 refills | Status: AC

## 2020-07-10 MED ORDER — GABAPENTIN 400 MG PO CAPS: 400.0000 mg | ORAL_CAPSULE | Freq: Three times a day (TID) | ORAL | 0 refills | Status: AC

## 2020-07-10 MED ORDER — TRAZODONE HCL 50 MG PO TABS: 50.0000 mg | ORAL_TABLET | Freq: Every evening | ORAL | 0 refills | Status: AC | PRN

## 2020-07-10 MED ORDER — RISAQUAD PO CAPS: 1.0000 | ORAL_CAPSULE | Freq: Every day | ORAL | 0 refills | Status: AC

## 2020-07-10 MED ORDER — CITALOPRAM HYDROBROMIDE 40 MG PO TABS: 40.0000 mg | ORAL_TABLET | Freq: Every day | ORAL | 0 refills | Status: DC

## 2020-07-10 MED ORDER — METRONIDAZOLE 500 MG PO TABS: 500.0000 mg | ORAL_TABLET | Freq: Two times a day (BID) | ORAL | 0 refills | Status: AC

## 2020-07-10 NOTE — Plan of Care (Signed)
I spoke with Dr. Vergie Living, on call for Gyn, regarding patient's elevated BHCG. He advised that the patient have a repeat BHCG today prior to discharge. The result on repeat today was 13. I spoke with him again and discussed the lab results. He states that since she reports that she is post-menopausal that this could be a false positive lab but that the patient needs this repeated with PCP/GYN within the next month as an outpatient. Patient was made aware of the lab abnormality and need for repeat testing as an outpatient without fail. She was also made aware that she needs repeat WBC and K+ in the next week for trending by PCP and social work to provide resources for where she can establish for primary care.    Bartholomew Crews, MD, Celene Skeen

## 2020-07-10 NOTE — Progress Notes (Signed)
Patient did not attend morning orientation group.  

## 2020-07-10 NOTE — Progress Notes (Signed)
Recreation Therapy Notes  Animal-Assisted Activity (AAA) Program Checklist/Progress Notes Patient Eligibility Criteria Checklist & Daily Group note for Rec Tx Intervention  Date: 7.5.22 Time: 1430 Location: [address]  AAA/T Program Assumption of Risk Form signed by Engineer, production or Parent Legal Guardian  YES  Patient is free of allergies or severe asthma  YES  Patient reports no fear of animals  YES   Patient reports no history of cruelty to animals YES  Patient understands his/her participation is voluntary  YES   Patient washes hands before animal contact YES   Patient washes hands after animal contact YES   Behavioral Response: Engaged  Education: Charity fundraiser, Appropriate Animal Interaction   Education Outcome: Acknowledges understanding/In group clarification offered/Needs additional education.   Clinical Observations/Feedback: Pt attended and interacted with [place].  Pt expressed no concerns.    Caroll Rancher, LRT/CTRS        Caroll Rancher A 07/10/2020 3:41 PM

## 2020-07-10 NOTE — Progress Notes (Signed)
  [facility] Adult Case Management Discharge Plan :  Will you be returning to the same living situation after discharge:  No. [facility] in [place]  At discharge, do you have transportation home?: Yes,  Friend  Do you have the ability to pay for your medications: Yes,  Community   Release of information consent forms completed and in the chart;  Patient's signature needed at discharge.  Patient to Follow up at:  Follow-up Information     Services, [facility] Follow up.   Why: A referral has been made to this facility for substance use treatment on your behalf. Please call daily to check on bed openings. Contact information: [address] [place] [address] [phone]         [facility] Follow up.   Specialty: Behavioral Health Why: You may go to this provider for therapy and medication management services during walk in hours:  Monday through Wednesday from 8:00 am to 11:00 am.  Services are provided on a first come, first served basis. Contact information: [address] [place] [address] [phone]        [facility]. Go to.   Why: Please go to this provider for therapy and medication management services. Services are provided on a first come, first served basis. Contact information: [address] [place], [place] [address]  [phone] [phone]        [facility] Follow up.   Why: Please go to this facility for medication management and therapy services. Contact information: [address] [place], [place] [address]  Phone: [phone] Fax: [phone]        [facility] Follow up.   Why: Per your MD, please follow-up at this facility for current STD and elevate beta HCG. Contact information: [address] [place], [place] [address] [phone]                Next level of care provider has access to [facility]  Link:no  Safety Planning and Suicide Prevention discussed: Yes,  Friend   Have you used any form of tobacco in the last 30 days? (Cigarettes, Smokeless Tobacco, Cigars, and/or Pipes): Yes  Has patient been referred to the Quitline?: Patient refused referral  Patient has been referred for addiction treatment: Yes  Aram Beecham, LCSWA 07/10/2020, 12:13 PM

## 2020-07-10 NOTE — Discharge Summary (Signed)
Physician Discharge Summary Note  Patient:  Tina Torres is an 50 y.o., female MRN:  [id number] DOB:  [birth year] Patient phone:  [phone] (home)  Patient address:   [place] [address],  Total Time spent with patient: 30 minutes  Date of Admission:  07/03/2020 Date of Discharge: 07/10/2020  Reason for Admission:  (From MD's admission note): Patient is a 50 year old female with a past psychiatric history significant for polysubstance use disorders, substance-induced mood disorder and suicidal ideation.  The patient presented to the [facility] emergency department on 07/02/2020 with suicidal ideation.  The patient stated that she had been homeless for the last 2 months, had been using fentanyl, heroin, crack and methamphetamines.  She was requesting detox.  She also reported feeling suicidal.  She had a plan to jump off a bridge.  She had been seen earlier this month on 06/09/2020.  These were similar circumstances at that time.  She was felt not to require inpatient psychiatric hospitalization and was given resources for homeless shelters as well as the [facility] 14-day residential program.  She stated that she had been at the 14-day program recently, and then released.  She stated it seemed as though she had been there for 2 months.  She stated that they did not give her any resources for her homelessness after she left.  She stated that she had used fentanyl 3 days in a row prior to admission.  Review of her admission laboratories revealed a blood alcohol less than 10, drug screen positive for amphetamines, and a drug screen positive for cocaine.  There were no opiates in her system.  She was admitted to the hospital for evaluation and stabilization.  Principal Problem: Severe recurrent major depression without psychotic features [facility]) Discharge Diagnoses: Principal Problem:   Severe recurrent major depression without psychotic features (HCC) Active Problems:    Polysubstance abuse (HCC)   Methamphetamine use disorder, severe (HCC)   MDD (major depressive disorder), recurrent severe, without psychosis (HCC)   Cocaine use disorder, severe, dependence (HCC)   Past Psychiatric History: See H&P  Past Medical History:  Past Medical History:  Diagnosis Date   Depression    Diverticulitis    Seizures (HCC)     Past Surgical History:  Procedure Laterality Date   COLOSTOMY REVERSAL     Family History: History reviewed. No pertinent family history. Family Psychiatric  History: See H&P Social History:  Social History   Substance and Sexual Activity  Alcohol Use Not Currently   Comment: last drink was 2/5th of liquor, been drinking "1 pint or more every day for a couple of weeks now"      Social History   Substance and Sexual Activity  Drug Use Not Currently   Types: Other-see comments, "Crack" cocaine, Heroin, Fentanyl   Comment: "kratom," pt said it's synthetic heroin    Social History   Socioeconomic History   Marital status: Unknown    Spouse name: Not on file   Number of children: Not on file   Years of education: Not on file   Highest education level: Not on file  Occupational History   Not on file  Tobacco Use   Smoking status: Every Day    Packs/day: 1.00    Pack years: 0.00    Types: Cigarettes   Smokeless tobacco: Never  Vaping Use   Vaping Use: Never used  Substance and Sexual Activity   Alcohol use: Not Currently    Comment: last drink was  2/5th of liquor, been drinking "1 pint or more every day for a couple of weeks now"    Drug use: Not Currently    Types: Other-see comments, "Crack" cocaine, Heroin, Fentanyl    Comment: "kratom," pt said it's synthetic heroin   Sexual activity: Not Currently  Other Topics Concern   Not on file  Social History Narrative   ** Merged History Encounter **       Social Determinants of Health   Financial Resource Strain: Not on file  Food Insecurity: Not on file   Transportation Needs: Not on file  Physical Activity: Not on file  Stress: Not on file  Social Connections: Not on file    Hospital Course:  After the above admission evaluation, Lisabeth's presenting symptoms were noted. She was recommended for mood stabilization treatments. The medication regimen targeting those presenting symptoms were discussed with her & initiated with her consent. She was restarted on her Celexa for depression. She was given Gabapentin to help with anxiety associated with withdrawal from cocaine and amphetamines. Her UDS on arrival to the ED was positive for amphetamines and cocaine, her BAL was negative. She was however medicated, stabilized & discharged on the medications as listed on her discharge medication list below. Besides the mood stabilization treatments, Mone was also enrolled & participated in the group counseling sessions being offered & held on this unit. She learned coping skills. She presented with an STI that required medical treatment. She was treated with a one time Rocephin IM followed by Doxycycline and Flagyl for 14 days, she has tolerated this well. She has bene told to follow up at the health department in [place] where she will be living in an [address]. She tolerated his treatment regimen without any adverse effects or reactions reported.   During the course of her hospitalization, the 15-minute checks were adequate to ensure patient's safety. Brendan did not display any dangerous, violent or suicidal behavior on the unit. She interacted with patients & staff appropriately, participated appropriately in the group sessions/therapies. Her medications were addressed & adjusted to meet her needs. She was recommended for outpatient follow-up care & medication management upon discharge to assure continuity of care & mood stability.  At the time of discharge patient is not reporting any acute suicidal/homicidal ideations. She feels more confident about her  self-care & in managing her mental health. She currently denies any new issues or concerns. Education and supportive counseling provided throughout her hospital stay & upon discharge.   Today upon her discharge evaluation with the attending psychiatrist, Fradel  shares she is doing well. She denies any other specific concerns. She is sleeping well. Her appetite is good. She denies other physical complaints. She denies AH/VH, delusional thoughts or paranoia. She does not appear to be responding to any internal stimuli. She feels that her medications have been helpful & is in agreement to continue her current treatment regimen as recommended. She was able to engage in safety planning including plan to return to [facility] or contact emergency services if she feels unable to maintain her own safety or the safety of others. Pt had no further questions, comments, or concerns. She left [facility] with all personal belongings in no apparent distress. Transportation per private vehicle with her friend.    Physical Findings: AIMS: Facial and Oral Movements Muscles of Facial Expression: None, normal Lips and Perioral Area: None, normal Jaw: None, normal Tongue: None, normal,Extremity Movements Upper (arms, wrists, hands, fingers): None, normal Lower (legs, knees,  ankles, toes): None, normal, Trunk Movements Neck, shoulders, hips: None, normal, Overall Severity Severity of abnormal movements (highest score from questions above): None, normal Incapacitation due to abnormal movements: None, normal Patient's awareness of abnormal movements (rate only patient's report): No Awareness, Dental Status Current problems with teeth and/or dentures?: No Does patient usually wear dentures?: No  CIWA:  CIWA-Ar Total: 1 COWS:  COWS Total Score: 6  Musculoskeletal: Strength & Muscle Tone: within normal limits Gait & Station: normal Patient leans: N/A  Psychiatric Specialty Exam:  Presentation  General Appearance: Appropriate  for Environment; Casual  Eye Contact:Good  Speech:Normal Rate  Speech Volume:Normal  Handedness:Right  Mood and Affect  Mood:Euthymic  Affect:Congruent  Thought Process  Thought Processes:Goal Directed  Descriptions of Associations:Intact  Orientation:Full (Time, Place and Person)  Thought Content:Logical  History of Schizophrenia/Schizoaffective disorder:No  Duration of Psychotic Symptoms:No data recorded Hallucinations:Hallucinations: None (Denies)  Ideas of Reference:None (Denies)  Suicidal Thoughts:Suicidal Thoughts: No (Denies)  Homicidal Thoughts:Homicidal Thoughts: No (Denies)  Sensorium  Memory:Immediate Good; Recent Good; Remote Fair  Judgment:Fair (at this time-wants rehab)  Insight:Fair  Executive Functions  Concentration:Good  Attention Span:Fair; Good  Recall:Good  Fund of Knowledge:Good  Language:Good  Psychomotor Activity  Psychomotor Activity:Psychomotor Activity: Normal  Assets  Assets:Communication Skills; Desire for Improvement; Housing; Leisure Time; Resilience; Social Support  Sleep  Sleep:Sleep: Good Number of Hours of Sleep: 6.75  Physical Exam: Physical Exam Vitals and nursing note reviewed.  HENT:     Head: Normocephalic.  Pulmonary:     Effort: Pulmonary effort is normal.  Musculoskeletal:        General: Normal range of motion.     Cervical back: Normal range of motion.  Neurological:     General: No focal deficit present.     Mental Status: She is oriented to person, place, and time.  Psychiatric:        Attention and Perception: Attention normal. She does not perceive auditory or visual hallucinations.        Mood and Affect: Mood normal.        Speech: Speech normal.        Behavior: Behavior normal. Behavior is cooperative.        Thought Content: Thought content normal. Thought content is not paranoid or delusional. Thought content does not include homicidal or suicidal ideation. Thought content does not  include homicidal or suicidal plan.        Cognition and Memory: Cognition normal.   Review of Systems  Constitutional: Negative.  Negative for fever.  HENT:  Negative for congestion, sinus pain and sore throat.   Respiratory: Negative.  Negative for cough and shortness of breath.   Cardiovascular: Negative.  Negative for chest pain.  Gastrointestinal: Negative.   Genitourinary: Negative.   Musculoskeletal: Negative.   Neurological: Negative.    Blood pressure 120/75, pulse 76, temperature 98 F (36.7 C), temperature source Oral, resp. rate 18, height  (1.549 m), weight 73 kg, last menstrual period 08/22/2019, SpO2 100 %. Body mass index is 30.42 kg/m.   Social History   Tobacco Use  Smoking Status Every Day   Packs/day: 1.00   Pack years: 0.00   Types: Cigarettes  Smokeless Tobacco Never   Tobacco Cessation:  A prescription for an FDA-approved tobacco cessation medication was offered at discharge and the patient refused   Blood Alcohol level:  Lab Results  Component Value Date   [facility] <10 07/02/2020   ETH <10 06/09/2020    Metabolic Disorder Labs:  Lab Results  Component Value Date   HGBA1C 5.3 02/21/2020   MPG 105 02/21/2020   MPG 105.41 06/12/2019   No results found for: PROLACTIN Lab Results  Component Value Date   CHOL 126 06/12/2019   TRIG 219 (H) 06/12/2019   HDL 46 06/12/2019   CHOLHDL 2.7 06/12/2019   VLDL 44 (H) 06/12/2019   LDLCALC 36 06/12/2019    See Psychiatric Specialty Exam and Suicide Risk Assessment completed by Attending Physician prior to discharge.  Discharge destination:  Other:  [facility], [place], [place]  Is patient on multiple antipsychotic therapies at discharge:  No   Has Patient had three or more failed trials of antipsychotic monotherapy by history:  No  Recommended Plan for Multiple Antipsychotic Therapies: NA  Discharge Instructions     Diet - low sodium heart healthy   Complete by: As directed    Increase activity  slowly   Complete by: As directed       Allergies as of 07/10/2020   No Known Allergies      Medication List     TAKE these medications      Indication  acidophilus Caps capsule Take 1 capsule by mouth daily. Start taking on: July 11, 2020  Indication: digestive health   citalopram 40 MG tablet Commonly known as: CELEXA Take 1 tablet (40 mg total) by mouth daily.  Indication: Depression   doxycycline 100 MG tablet Commonly known as: VIBRA-TABS Take 1 tablet (100 mg total) by mouth every 12 (twelve) hours.  Indication: Uncomplicated Gonorrhea   gabapentin 400 MG capsule Commonly known as: NEURONTIN Take 1 capsule (400 mg total) by mouth 3 (three) times daily. What changed: See the new instructions.  Indication: Abuse or Misuse of Alcohol   metroNIDAZOLE 500 MG tablet Commonly known as: FLAGYL Take 1 tablet (500 mg total) by mouth every 12 (twelve) hours.  Indication: Vaginosis caused by Bacteria   traZODone 50 MG tablet Commonly known as: DESYREL Take 1 tablet (50 mg total) by mouth at bedtime as needed for sleep.  Indication: Trouble Sleeping        Follow-up Information     [facility] Follow up.   Why: A referral has been made to this facility for substance use treatment on your behalf. Please call daily to check on bed openings. Contact information: [address] [place] [address] [phone]         [facility] Follow up.   Specialty: Behavioral Health Why: You may go to this provider for therapy and medication management services during walk in hours:  Monday through Wednesday from 8:00 am to 11:00 am.  Services are provided on a first come, first served basis. Contact information: [address] [place] [id number] [phone]        [facility]. Go to.   Why: Please go to this provider for therapy and medication management services. Services are  provided on a first come, first served basis. Contact information: [address] [place], [place] [address]  [phone] [phone]        [facility] Follow up.   Why: Please go to this facility for medication management and therapy services. Contact information: [address] [place], [place] [address]  Phone: [phone] Fax: [phone]        [facility] Follow up.   Why: Per your MD, please follow-up at this facility for  current STD and elevate beta HCG. Contact information: [address] [place], [place] [address] [phone]                Follow-up recommendations:  Activity:  as tolerated Diet:  Heart healthy  Comments:  Prescriptions were given at discharge.  Patient is agreeable with the discharge plan.  She was given an opportunity to ask questions.  She appears to feel comfortable with discharge and denies any current suicidal or homicidal thoughts.   Patient is instructed prior to discharge to: Take all medications as prescribed by her mental healthcare provider. Report any adverse effects and or reactions from the medicines to her outpatient provider promptly. Patient has been instructed & cautioned: To not engage in alcohol and or illegal drug use while on prescription medicines. In the event of worsening symptoms, patient is instructed to call the crisis hotline, 911 and or go to the nearest ED for appropriate evaluation and treatment of symptoms. To follow-up with her primary care provider for your other medical issues, concerns and or health care needs.   Signed: Laveda Abbe, NP 07/10/2020, 12:54 PM

## 2020-07-10 NOTE — Progress Notes (Signed)
Discharge Note:  Patient discharged home. Patient denied SI and HI.  Denied A/V hallucinations.  Suicide prevention information given and discussed with patient who stated she understood and had no questions.  Patient stated she received all her belongings, clothing, toiletries, misc items, etc.  Patient stated she appreciated all assistance received from [facility] staff.  All required discharge information given to patient.  

## 2020-07-10 NOTE — Progress Notes (Signed)
   07/10/20 0048  Charting Type  Charting Type Shift assessment  Assessment of needs addressed Yes  Orders Chart Check (once per shift) Completed  Safety Check Verification  Has the RN verified the 15 minute safety check completion? Yes  Neurological  Neuro (WDL) WDL  HEENT  HEENT (WDL) WDL  Respiratory  Respiratory (WDL) WDL  Cardiac  Cardiac (WDL) WDL  Vascular  Vascular (WDL) WDL  Integumentary  Integumentary (WDL) X (no new findings)  Braden Scale (Ages 8 and up)  Sensory Perceptions 4  Moisture 4  Activity 3  Mobility 4  Nutrition 3  Friction and Shear 3  Braden Scale Score 21  Musculoskeletal  Musculoskeletal (WDL) WDL  Gastrointestinal  Gastrointestinal (WDL) WDL  GU Assessment  Genitourinary (WDL) WDL  Neurological  Level of Consciousness Alert  D: Patient in her room most of the evening. Pt response to writer was minimal and appeared irritable. Pt did ask for something for sleep which was provided. A: Medications administered as prescribed. Support and encouragement provided as needed.  R: Patient remains safe on the unit. Will continue to monitor for safety and stability.

## 2020-07-10 NOTE — [facility] Suicide Risk Assessment (Addendum)
[employer] [facility] Discharge Suicide Risk Assessment   Principal Problem: Severe recurrent major depression without psychotic features [facility]) Discharge Diagnoses: Principal Problem:   Severe recurrent major depression without psychotic features (HCC) Active Problems:   Polysubstance abuse (HCC)   Methamphetamine use disorder, severe (HCC)   MDD (major depressive disorder), recurrent severe, without psychosis (HCC)   Cocaine use disorder, severe, dependence (HCC)  Total Time Spent in Direct Patient Care:  I personally spent 45 minutes on the unit in direct patient care. The direct patient care time included face-to-face time with the patient, reviewing the patient's chart, communicating with other professionals, and coordinating care. Greater than 50% of this time was spent in counseling or coordinating care with the patient regarding goals of hospitalization, psycho-education, and discharge planning needs.  Subjective: Patient was seen on rounds. She denies SI, HI, AVH, paranoia, or delusions. She denies current signs of withdrawal or cravings for substances and denies medication side-effects. She reports fair sleep and stable appetite and voices no physical complaints. She was made aware that she has a chronically elevated WBC that needs to be repeated by a PCP within a week for trending and has a slightly low K+ that needs to be repeated by a PCP within a week without fail. She was made aware that her beta HCG level was abnormal and needs to be repeated by a GYN doctor in the next month without fail. Time was given for questions.   Musculoskeletal: Strength & Muscle Tone: within normal limits Gait & Station: normal, steady Patient leans: N/A Psychiatric Specialty Exam: Physical Exam Vitals reviewed.  HENT:     Head: Normocephalic.  Pulmonary:     Effort: Pulmonary effort is normal.  Neurological:     General: No focal deficit present.     Mental Status: She is alert.      Blood pressure 120/75,  pulse 76, temperature 98 F (36.7 C), temperature source Oral, resp. rate 18, height 5\' 1"  (1.549 m), weight 73 kg, last menstrual period 08/22/2019, SpO2 100 %.Body mass index is 30.42 kg/m.  General Appearance: casually dressed, adequate hygiene  Eye Contact:  Good  Speech:  Clear and Coherent and Normal Rate  Volume:  Normal  Mood:   described as improved - appears euthymic  Affect:   moderate, stable, full  Thought Process:  Goal Directed and Linear  Orientation:  Full (Time, Place, and Person)  Thought Content:  Logical and denies AVH, paranoia, or delusions  Suicidal Thoughts:  No  Homicidal Thoughts:  No  Memory:  Recent;   Good  Judgement:  Fair  Insight:  Fair  Psychomotor Activity:  Normal  Concentration:  Concentration: Good and Attention Span: Good  Recall:  Good  Fund of Knowledge:  Good  Language:  Good  Akathisia:  Negative  Assets:  Communication Skills Desire for Improvement Resilience  ADL's:  Intact  Cognition:  WNL  Sleep:  Number of Hours: 5    Mental Status Per Nursing Assessment::   On Admission:  NA  Demographic Factors:  Caucasian, unemployed, low socioeconomic status  Loss Factors: Homelessness, low socioeconomic status  Historical Factors: Substance use prior to admission  Risk Reduction Factors:   Positive social support and Positive coping skills or problem solving skills  Continued Clinical Symptoms:  Depression:   Comorbid alcohol abuse/dependence Alcohol/Substance Abuse/Dependencies Previous Psychiatric Diagnoses and Treatments  Cognitive Features That Contribute To Risk:  None    Suicide Risk:  Minimal: No identifiable suicidal ideation. may be classified as  minimal risk based on the severity of the depressive symptoms   Follow-up Information     Services, [facility] Follow up.   Why: A referral has been made to this facility for substance use treatment on your behalf. Please call daily to check on bed  openings. Contact information: [address] [place] [address] [phone]         [facility] Follow up.   Specialty: Behavioral Health Why: You may go to this provider for therapy and medication management services during walk in hours:  Monday through Wednesday from 8:00 am to 11:00 am.  Services are provided on a first come, first served basis. Contact information: [address] [place] [address] [phone]        [facility]. Go to.   Why: Please go to this provider for therapy and medication management services. Services are provided on a first come, first served basis. Contact information: [address] [place], [place] [address]  [phone] [phone]        [facility] Follow up.   Why: Please go to this facility for medication management and therapy services. Contact information: [address] [place], [place] [address]  Phone: [phone] Fax: [phone]        [facility] Follow up.   Why: Per your MD, please follow-up at this facility for current STD and elevate beta HCG. Contact information: [address] [place], [place] [address] [phone]                Plan Of Care/Follow-up recommendations:  Activity:  as tolerated Diet:  heart healthy Other:  Patient advised to see a primary care provider or the Health Department without fail in the next week for repeat white blood cell count, repeat creatinine, and repeat potassium blood level. She was advised that she needs to see a Gynecologist without fail in the next month for repeat testing and work up of her abnormal beta HCG hormone level. She was advised to have a primary care provider recheck her urine for trace blood after discharge without fail. She was advised to let any sexual partners know she was positive for STD and to complete her antibiotic course. She was  encouraged to abstain from alcohol and illicit substance use and to keep outpatient mental health/substance abuse appointments without fail.   Comer Locket, MD 07/10/2020, 12:12 PM

## 2020-08-13 ENCOUNTER — Other Ambulatory Visit (HOSPITAL_COMMUNITY): Payer: Self-pay | Admitting: Psychiatry

## 2020-08-13 DIAGNOSIS — F331 Major depressive disorder, recurrent, moderate: Secondary | ICD-10-CM

## 2020-09-11 ENCOUNTER — Other Ambulatory Visit (HOSPITAL_COMMUNITY): Payer: Self-pay | Admitting: Psychiatry

## 2020-09-11 DIAGNOSIS — F331 Major depressive disorder, recurrent, moderate: Secondary | ICD-10-CM

## 2021-08-07 IMAGING — MR MR THORACIC SPINE WO/W CM
7 of 11 series · 28 of 48 positions shown · IV contrast (gadavist)
Comparison: CT of the chest and abdomen from earlier yesterday
evening

CLINICAL DATA: Mid back pain

EXAM:
MRI THORACIC WITHOUT AND WITH CONTRAST
TECHNIQUE: Multiplanar and multiecho pulse sequences of the thoracic spine were
obtained without and with intravenous contrast.
CONTRAST:  7mL GADAVIST GADOBUTROL 1 MMOL/ML IV SOLN

[Series 14: T1 · sagittal · 6.0mm · 1.23mm/px · 2 of 8 slices shown (1 of 3)]
[im 1/8]
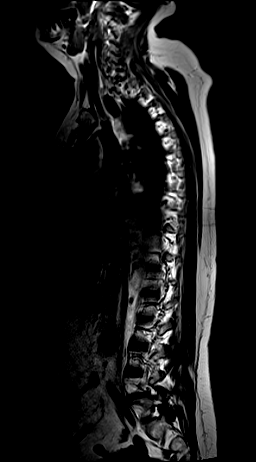
[im 8/8]
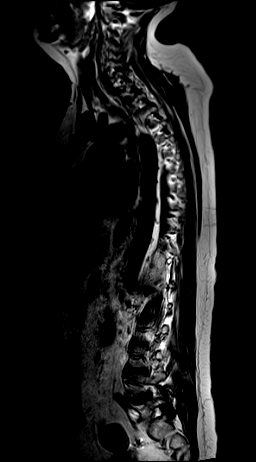

[Series 15: T2 · sagittal · 3.0mm · 0.76mm/px · 4 of 17 slices shown (1 of 4)]
[im 1/17]
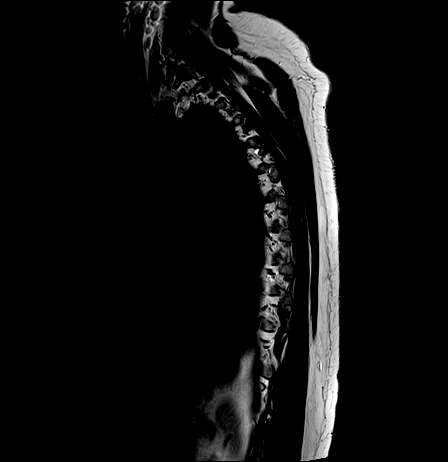
[im 6/17]
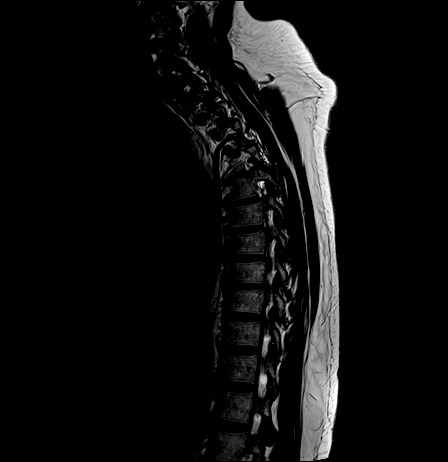
[im 11/17]
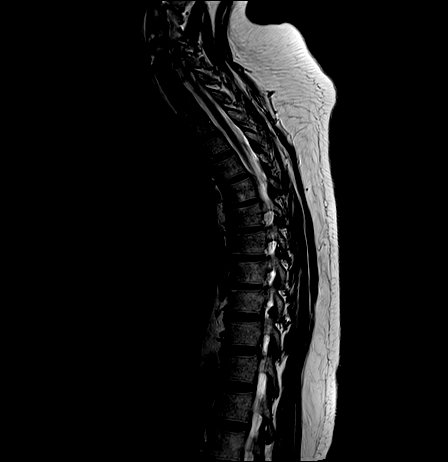
[im 17/17]
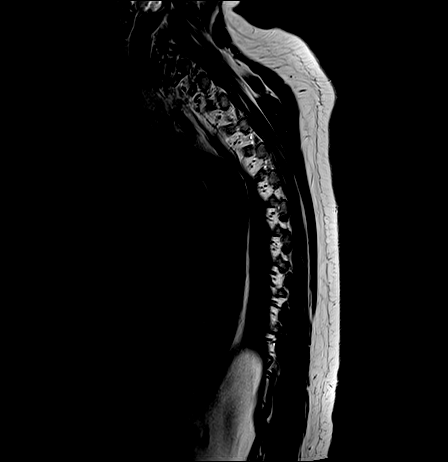

[Series 16: T1 · sagittal · 3.0mm · 0.76mm/px · 3 of 17 slices shown (2 of 3)]
[im 1/17]
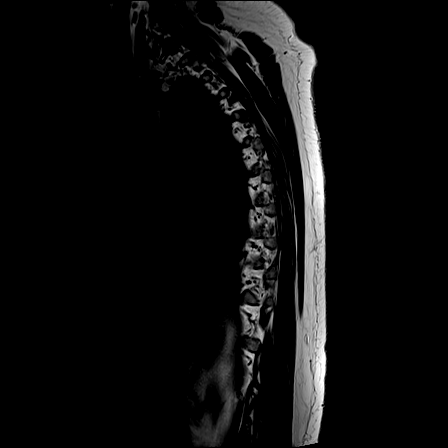
[im 9/17]
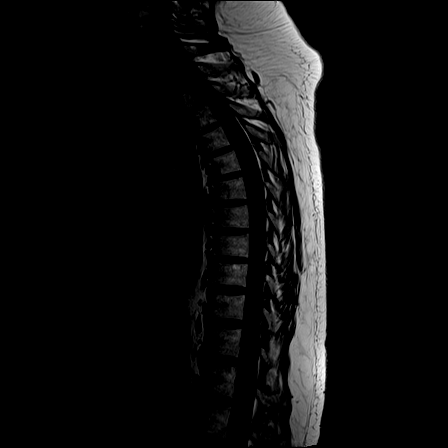
[im 17/17]
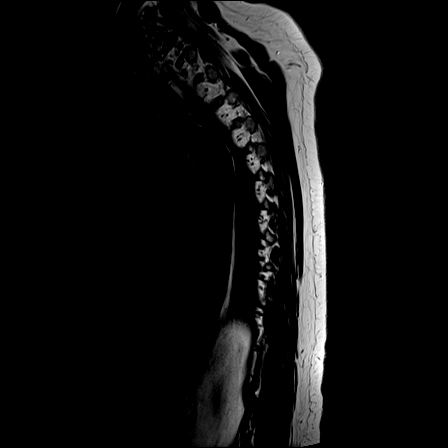

[Series 18: T2 · sagittal · 3.0mm · 0.76mm/px · 3 of 17 slices shown (2 of 4)]
[im 1/17]
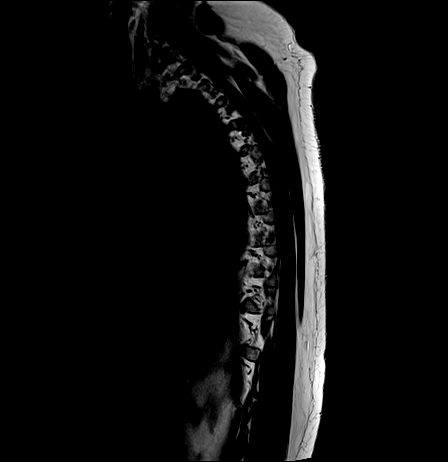
[im 9/17]
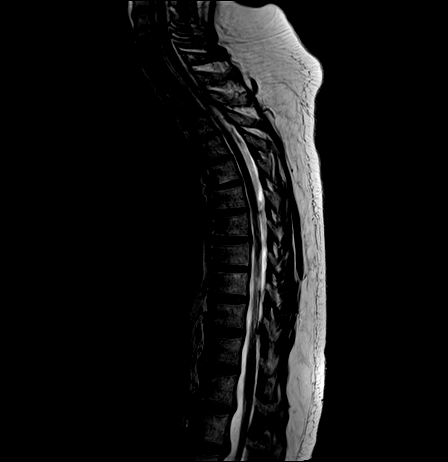
[im 17/17]
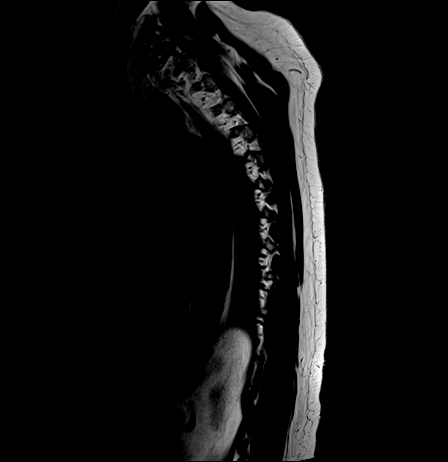

[Series 19: T2 · axial · 4.0mm · 0.59mm/px · z∈[-193,+13]mm · 6 of 39 slices shown (3 of 4)]
[im 1/39]
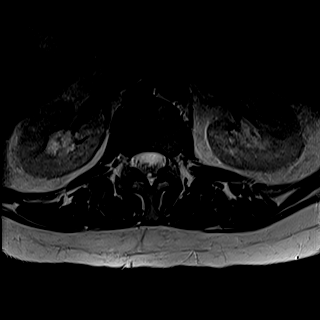
[im 8/39]
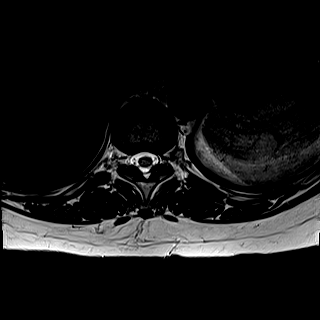
[im 16/39]
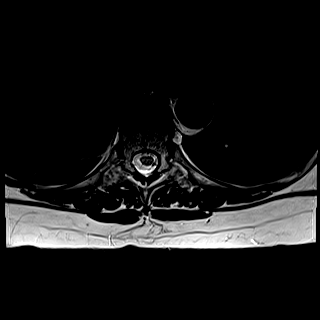
[im 23/39]
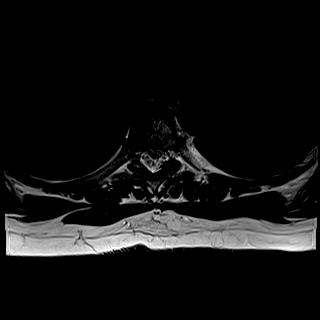
[im 31/39]
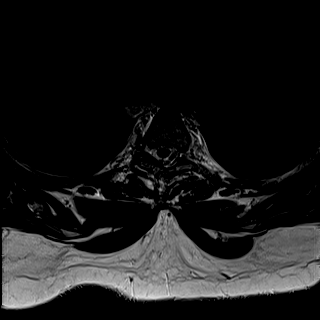
[im 39/39]
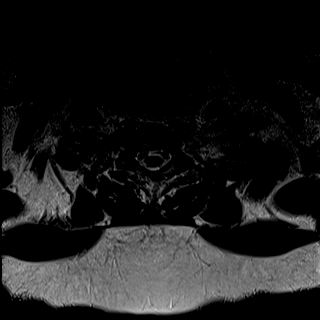

[Series 20: T1 · axial · non-contrast · 4.0mm · 0.31mm/px · z∈[-193,-44]mm · 4 of 39 slices shown (3 of 3)]
[im 1/39]
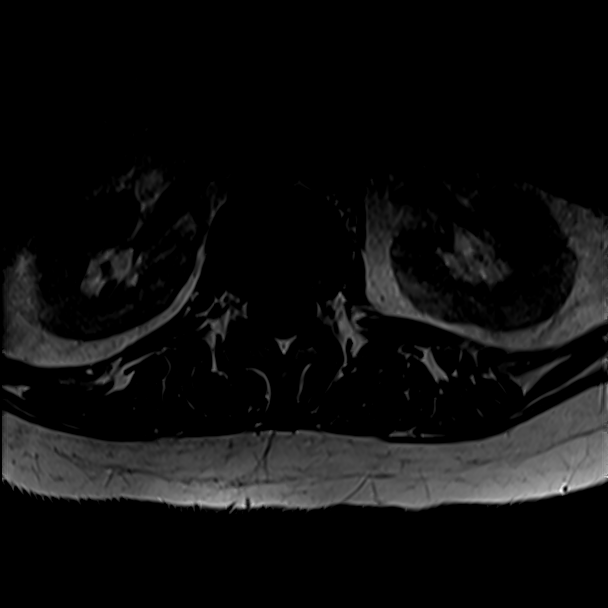
[im 8/39]
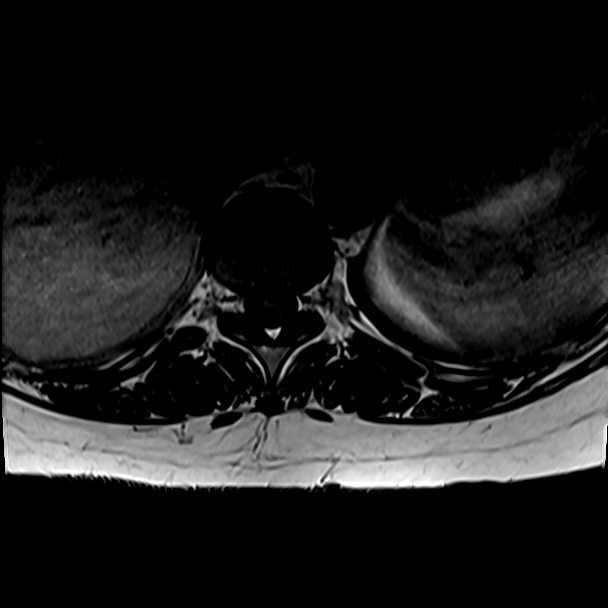
[im 16/39]
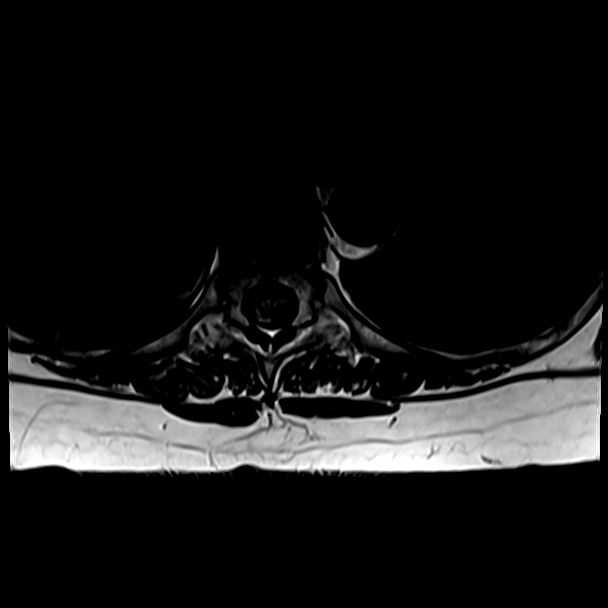
[im 23/39]
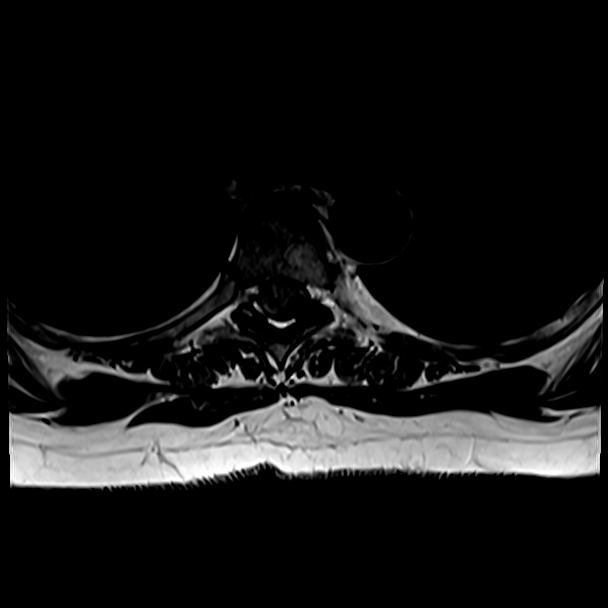

[Series 22: T2 · axial · 4.0mm · 0.59mm/px · z∈[-193,+13]mm · 6 of 39 slices shown (4 of 4)]
[im 1/39]
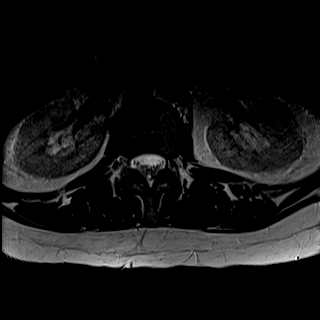
[im 8/39]
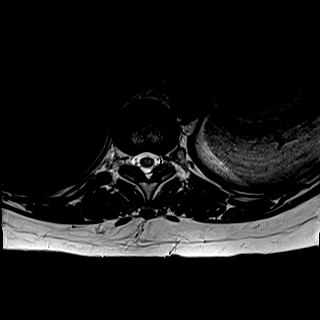
[im 16/39]
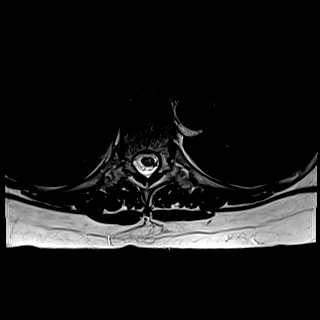
[im 23/39]
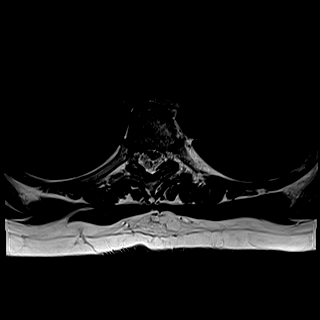
[im 31/39]
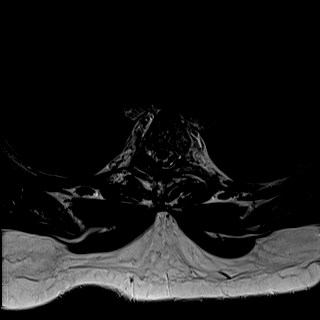
[im 39/39]
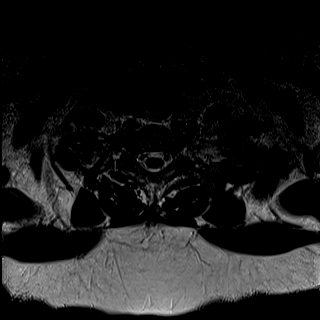

[28 of 48 positions shown; findings below may reference images not displayed]

FINDINGS: MRI THORACIC SPINE FINDINGS

Alignment: No traumatic malalignment. Exaggerated upper thoracic
kyphosis.

Vertebrae: No visible occult fracture.

Cord:  Normal signal.  No spinal cord collection is seen

Paraspinal and other soft tissues: Prevertebral/paravertebral edema
is seen from C3-4 to T4, presumably traumatic in this setting. No
evidence of underlying occult fracture. At a C5-6 there is equivocal
interspinous edematous signal. The ligamentum flavum is intact on
sagittal T2 weighted imaging.

Disc levels:

Disc narrowing and mild facet spurring at the upper thoracic levels.
Small disc protrusions in the thoracic spine primarily at T8-9 to
T10-11.

There are disc protrusions at multiple levels in the lumbar spine,
partially covered. Lower lumbar facet osteoarthritis with L4-5
anterolisthesis.
IMPRESSION: 1. Prevertebral edema from C3-4 to T4 with equivocal intraspinous
strain at C5-6. No occult fracture or visible ligamentous
disruption.
2. Degenerative changes are noted above. No thoracic cord
impingement or collection.
